# Patient Record
Sex: Female | Born: 1980 | Race: White | Hispanic: Yes | Marital: Married | Smoking: Never smoker
Health system: Southern US, Community
[De-identification: ages and names within clinical notes are randomized; demographics above are authoritative.]

## PROBLEM LIST (undated history)

## (undated) DIAGNOSIS — E039 Hypothyroidism, unspecified: Secondary | ICD-10-CM

## (undated) HISTORY — PX: APPENDECTOMY: SHX54

## (undated) HISTORY — DX: Hypothyroidism, unspecified: E03.9

---

## 2000-12-02 ENCOUNTER — Inpatient Hospital Stay (HOSPITAL_COMMUNITY): Admission: AD | Admit: 2000-12-02 | Discharge: 2000-12-06 | Payer: Self-pay | Admitting: *Deleted

## 2000-12-02 ENCOUNTER — Inpatient Hospital Stay (HOSPITAL_COMMUNITY): Admission: AD | Admit: 2000-12-02 | Discharge: 2000-12-02 | Payer: Self-pay | Admitting: *Deleted

## 2001-02-09 ENCOUNTER — Emergency Department (HOSPITAL_COMMUNITY): Admission: EM | Admit: 2001-02-09 | Discharge: 2001-02-09 | Payer: Self-pay | Admitting: Emergency Medicine

## 2001-02-09 ENCOUNTER — Encounter: Payer: Self-pay | Admitting: *Deleted

## 2003-05-20 ENCOUNTER — Encounter: Admission: RE | Admit: 2003-05-20 | Discharge: 2003-05-20 | Payer: Self-pay | Admitting: *Deleted

## 2003-05-27 ENCOUNTER — Encounter: Admission: RE | Admit: 2003-05-27 | Discharge: 2003-05-27 | Payer: Self-pay | Admitting: *Deleted

## 2003-05-29 ENCOUNTER — Encounter: Admission: RE | Admit: 2003-05-29 | Discharge: 2003-05-29 | Payer: Self-pay | Admitting: *Deleted

## 2003-05-30 ENCOUNTER — Inpatient Hospital Stay (HOSPITAL_COMMUNITY): Admission: AD | Admit: 2003-05-30 | Discharge: 2003-06-01 | Payer: Self-pay | Admitting: Obstetrics and Gynecology

## 2006-05-23 ENCOUNTER — Encounter (HOSPITAL_COMMUNITY): Admission: RE | Admit: 2006-05-23 | Discharge: 2006-05-24 | Payer: Self-pay | Admitting: Internal Medicine

## 2006-07-27 ENCOUNTER — Encounter: Payer: Self-pay | Admitting: Family Medicine

## 2008-09-28 ENCOUNTER — Encounter: Payer: Self-pay | Admitting: *Deleted

## 2008-09-28 ENCOUNTER — Ambulatory Visit: Payer: Self-pay | Admitting: Family Medicine

## 2008-09-28 DIAGNOSIS — E018 Other iodine-deficiency related thyroid disorders and allied conditions: Secondary | ICD-10-CM

## 2008-10-28 ENCOUNTER — Ambulatory Visit: Payer: Self-pay | Admitting: Family Medicine

## 2008-10-28 ENCOUNTER — Encounter: Payer: Self-pay | Admitting: Family Medicine

## 2008-10-30 LAB — CONVERTED CEMR LAB
Cholesterol: 125 mg/dL (ref 0–200)
HDL: 48 mg/dL (ref >39)
LDL Cholesterol: 66 mg/dL (ref 0–99)
TSH: 14.447 microintl units/mL — ABNORMAL HIGH (ref 0.350–4.500)
Total CHOL/HDL Ratio: 2.6
Triglycerides: 54 mg/dL (ref <150)
VLDL: 11 mg/dL (ref 0–40)

## 2008-12-30 ENCOUNTER — Encounter: Payer: Self-pay | Admitting: Family Medicine

## 2009-01-08 ENCOUNTER — Ambulatory Visit: Payer: Self-pay | Admitting: Family Medicine

## 2009-01-11 LAB — CONVERTED CEMR LAB: TSH: 1.029 microintl units/mL (ref 0.350–4.500)

## 2009-09-17 ENCOUNTER — Ambulatory Visit: Payer: Self-pay | Admitting: Family Medicine

## 2009-09-17 LAB — CONVERTED CEMR LAB: Pap Smear: NEGATIVE

## 2009-09-23 ENCOUNTER — Encounter: Payer: Self-pay | Admitting: Family Medicine

## 2009-11-24 ENCOUNTER — Ambulatory Visit: Payer: Self-pay | Admitting: Family Medicine

## 2009-11-24 LAB — CONVERTED CEMR LAB: TSH: 0.332 microintl units/mL — ABNORMAL LOW (ref 0.350–4.500)

## 2009-11-26 ENCOUNTER — Telehealth: Payer: Self-pay | Admitting: Family Medicine

## 2010-07-20 ENCOUNTER — Encounter: Payer: Self-pay | Admitting: Family Medicine

## 2010-07-21 ENCOUNTER — Telehealth: Payer: Self-pay | Admitting: *Deleted

## 2010-07-26 NOTE — Assessment & Plan Note (Signed)
Summary: cpp/[place]   Vital Signs:  Patient profile:   30 year old female Menstrual status:  regular Height:      61 inches Weight:      146 pounds BMI:     27.69 Temp:     97.7 degrees F oral Pulse rate:   60 / minute BP sitting:   105 / 68  (left arm) Cuff size:   regular  Vitals Entered By: Tessie Fass CMA (September 17, 2009 3:46 PM) CC: complete physical with pap Is Patient Diabetic? No Pain Assessment Patient in pain? no        CC:  complete physical with pap.  History of Present Illness: Visit conducted in [place].   Sandra [name] comes in today for her PAP smear.  She states her [facility] yrs ago, has never been abnormal.  LMP September 12, 2009, regular.  Uses rhythm method for pregnancy prevention, has been successful.    No STI history. Married, lives at home with sons (ages 17 and 65) and husband.   Prior hyperthyroidism, treated with RAIA by Dr Napoleon Form in [place].  Is still taking LT4, same dose.  Prior TSH in July normal.  Plans to recheck in July 2011.   Habits & Providers  Alcohol-Tobacco-Diet     Tobacco Status: never  Allergies: No Known Drug Allergies  Social History: Has lived here 8 years. From [place]. Lives here with her husband and 2 boys - 11 yo and 20 yo.  No smoking, no ETOH, no drugs.   September 17, 2009: Married, has 2 sons (ages 32 and 26).  Uses rhythm method. LMP 09/12/2009  Physical Exam  General:  Well-developed,well-nourished,in no acute distress; alert,appropriate and cooperative throughout examination Eyes:  L eye proptosis from prior hyperthyroidism  Abdomen:  Bowel sounds positive,abdomen soft and non-tender without masses, organomegaly or hernias noted. Genitalia:  Normal introitus for age, no external lesions, no vaginal discharge, mucosa pink and moist, no vaginal or cervical lesions, no vaginal atrophy, no friaility or hemorrhage, normal uterus size and position, no adnexal masses or tenderness   Impression &  Recommendations:  Problem # 1:  SCREENING FOR MALIGNANT NEOPLASM OF THE CERVIX (ICD-V76.2) Patient with no prior history of abnormal PAP by her report.  She states her last PAP was done in [facility] about 2 yrs ago, she was notified that it was normal.  Plan to repeat in 3 yrs if today's study comes back normal.   No cervical cultures collected, as without discharge or prior STI history.   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 Napoleon Form at [facility] and now treated for hypothyroidism.  Last July her TSH was normal.  For recheck in July of this year.  She knows to call ahead to let the lab know when she will come in.  I will contact her with the results.  Her updated medication list for this problem includes:    Synthroid 150 Mcg Tabs (Levothyroxine sodium) .Marland Kitchen... Take one tablet daily  Orders: [facility]- Est Level  3 ([address])Future Orders: TSH-[facility] ([id number]) ... 09/09/2010  Complete Medication List: 1)  Synthroid 150 Mcg Tabs (Levothyroxine sodium) .... Take one tablet daily  Patient Instructions: 1)  Fue un placer verle hoy en la consulta.  Le entro en contacto con los resultados del [place] cuando esten listos.  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  2)  Vuelva para el chequeo de la tiroide en el mes de [place].  No es necesario estar en ayunas para esta prueba de [place].  Puede venir cualquier dia entresemana.

## 2010-07-26 NOTE — Letter (Signed)
Summary: Generic Letter  [facility]  [address]   [place], [place] [address]   Phone: [phone]  Fax: [phone]    09/23/2009  [facility] [address] [place], [place]  [address]  [place]. RAMIREZ-GARCIA,  Espero que esta carta le [employer].  Escribo para decirle que el resultado del [place] fue NORMAL.  No hubo ningun hallazgo precanceroso en la [place].   Se recomienda que vuelva a repetir [employer].  Por favor llame con cualquier pregunta.    Sinceramente,   Paula Compton MD  Appended Document: Generic Letter mailed.

## 2010-07-26 NOTE — Assessment & Plan Note (Signed)
Summary: needs thyroid meds/Napavine/breen   Vital Signs:  Patient profile:   30 year old female Menstrual status:  regular Weight:      143.7 pounds Pulse rate:   69 / minute BP sitting:   116 / 73  (right arm)  Vitals Entered By: Arlyss Repress CMA, (November 24, 2009 2:31 PM) CC: refill thyroid meds. Is Patient Diabetic? No Pain Assessment Patient in pain? no        Primary Care Provider:  Paula Compton MD  CC:  refill thyroid meds..  History of Present Illness: 34 YOF w/ PMHx/o hypothyroidism s/p RAIA here for hypothyroidism followup: Hypothyroidism: Pt denies weight gain, change in appetite, HA, CP, Abd pain. Pt does report intermittent fatigue and dizziness as well as intermittent increased energy over last 1-2 weeks. Most recent TSH @ 1.03 10/2008 w/ synthroid @ 150 micrograms daily in setting of adequate synthroid titration. Pt reports compliance w/ medication. However, pt unable to obtain final refill. Has been without medication for 3 days.   Habits & Providers  Alcohol-Tobacco-Diet     Tobacco Status: never  Allergies: No Known Drug Allergies  Review of Systems       See HPI  Physical Exam  General:  alert, well-developed, and healthy-appearing.   Head:  normocephalic and atraumatic.   Eyes:  vision grossly intact.   Neck:  supple and full ROM.   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  PHQ 9 screen performed in setting of fatigue complaint. Score of 3. Pt denies depression sxs, SI/HI. Fatigue likely secondary to hypothyroidism. Case discussed w/ PCP Dr. Mauricio Po. Plan for pt to followup w/ PCP in 4 weeks for reassessment. Her updated medication list for this problem includes:    Synthroid 150 Mcg Tabs (Levothyroxine  sodium) .Marland Kitchen... Take one tablet daily  Orders: TSH-[facility] ([id number]) [facility]- Est Level  3 ([address]782)  Complete Medication List: 1)  Synthroid 150 Mcg Tabs (Levothyroxine sodium) .... Take one tablet daily Prescriptions: SYNTHROID 150 MCG TABS (LEVOTHYROXINE SODIUM) Take one tablet daily  #30 x 1   Entered and Authorized by:   Doree Albee MD   Signed by:   Doree Albee MD on 11/24/2009   Method used:   Print then Give to Patient   RxID:   [id number]

## 2010-07-26 NOTE — Progress Notes (Signed)
  Phone Note Outgoing Call   Call placed by: Paula Compton MD,  November 26, 2009 10:10 AM Call placed to: Patient Summary of Call: Call placed to patient, in Spanish.  Mildly low TSH with symptoms, taking current dose daily.  Will reduce dose at [facility], and recheck TSH in 6 to 8 weeks.  She knows to call the week prior to let us know when she will come for lab-only.  Initial call taken by: Paula Compton MD,  November 26, 2009 10:14 AM    New/Updated Medications: LEVOTHYROXINE SODIUM 125 MCG TABS (LEVOTHYROXINE SODIUM) 1 by mouth one time daily Spanish language instructions Prescriptions: LEVOTHYROXINE SODIUM 125 MCG TABS (LEVOTHYROXINE SODIUM) 1 by mouth one time daily Spanish language instructions  #30 x 6   Entered and Authorized by:   Paula Compton MD   Signed by:   Paula Compton MD on 11/26/2009   Method used:   Electronically to        [facility] [address] (retail)       [phone]       [place], [place]  [address]       Ph: [phone]       Fax: [phone]   RxID:   [id number]

## 2010-07-28 NOTE — Letter (Signed)
Summary: Generic Letter  [facility]  [address]   [place], [place] [address]   Phone: [phone]  Fax: [phone]    07/20/2010  [facility] [address] [place], [place]  [address]  Sandra [name],     Acabo de recibir un pedido para rellenar la receta para la medicina de la tiroide (levotiroxina).  Es muy importante que venga a nuestra oficina para volver a Corporate treasurer de sangre para la funcion tiroidea (no tiene que Engineer, maintenance).  Por favor llame [phone]) para marcar para el laboratorio lo antes posible.      Sinceramente,   Paula Compton MD  Appended Document: Generic Letter mailed

## 2010-07-28 NOTE — Progress Notes (Signed)
Summary: Refill  Phone Note Refill Request   Refills Requested: Medication #1:  LEVOTHYROXINE SODIUM 125 MCG TABS 1 by mouth one time daily Spanish language instructions. pt is running out medicine and pt does'nt have orange card. Pt will renew orange card this week and will call for OV    Initial call taken by: Marines Jean Rosenthal,  July 21, 2010 10:20 AM Caller: Patient  Follow-up for Phone Call        The medication is ready.  Will have interpreter call patient to let her know. Follow-up by: Dennison Nancy RN,  July 21, 2010 10:51 AM

## 2010-08-19 ENCOUNTER — Encounter: Payer: Self-pay | Admitting: *Deleted

## 2010-09-01 ENCOUNTER — Other Ambulatory Visit: Payer: Self-pay

## 2010-09-01 DIAGNOSIS — E018 Other iodine-deficiency related thyroid disorders and allied conditions: Secondary | ICD-10-CM

## 2010-09-01 LAB — TSH: TSH: 3.986 u[IU]/mL (ref 0.350–4.500)

## 2010-09-02 ENCOUNTER — Telehealth: Payer: Self-pay | Admitting: Family Medicine

## 2010-09-02 DIAGNOSIS — E039 Hypothyroidism, unspecified: Secondary | ICD-10-CM

## 2010-09-02 MED ORDER — LEVOTHYROXINE SODIUM 125 MCG PO TABS: 125.0000 ug | ORAL_TABLET | Freq: Every day | ORAL | Status: DC

## 2010-09-02 NOTE — Telephone Encounter (Signed)
Call completed in Spanish. Patient feels well.  TSH is 3.9.  Will refill LT4 daily, to recheck in 6 months. She voices understanding.

## 2010-10-20 ENCOUNTER — Encounter: Payer: Self-pay | Admitting: Family Medicine

## 2010-10-20 ENCOUNTER — Ambulatory Visit (INDEPENDENT_AMBULATORY_CARE_PROVIDER_SITE_OTHER): Payer: Self-pay | Admitting: Family Medicine

## 2010-10-20 DIAGNOSIS — Z23 Encounter for immunization: Secondary | ICD-10-CM

## 2010-10-20 DIAGNOSIS — S61219A Laceration without foreign body of unspecified finger without damage to nail, initial encounter: Secondary | ICD-10-CM | POA: Insufficient documentation

## 2010-10-20 DIAGNOSIS — S61209A Unspecified open wound of unspecified finger without damage to nail, initial encounter: Secondary | ICD-10-CM

## 2010-10-20 MED ORDER — TETANUS-DIPHTH-ACELL PERTUSSIS 5-2.5-18.5 LF-MCG/0.5 IM SUSP
0.5000 mL | Freq: Once | INTRAMUSCULAR | Status: AC
  Administered 2010-10-20: 0.5 mL via INTRAMUSCULAR

## 2010-10-20 MED ORDER — HYDROCODONE-ACETAMINOPHEN 5-500 MG PO TABS: 2.0000 | ORAL_TABLET | Freq: Four times a day (QID) | ORAL | Status: DC | PRN

## 2010-10-20 MED ORDER — AMOXICILLIN-POT CLAVULANATE 875-125 MG PO TABS: 1.0000 | ORAL_TABLET | Freq: Two times a day (BID) | ORAL | Status: AC

## 2010-10-20 NOTE — Progress Notes (Signed)
  Subjective:    Patient ID: Sandra [name], female    DOB: Jan 22, 1981, 30 y.o.   MRN: [id number]  HPI 1. Finger laceration:  Pt was washing dishes yesterday evening.   A plate broke and fell near a broken blender.  She reached in and the next thing she knew is that her finger was bleeding and painful.   It was her left 2nd finger.  She put some gauze on it and it did stop bleeding.  She is still having significant pain.  She is still able to move the finger but it is painful  Spent greater than 40 minutes with patient   Review of Systems Denies fevers, chills, malaise, n/v    Objective:   Physical Exam  Constitutional: No distress.  Cardiovascular: Normal rate and regular rhythm.   Pulmonary/Chest: Effort normal and breath sounds normal.  Musculoskeletal:       Left 2nd finger on the flexor side:  Large laceration from the lateral PIP joint down the finger then crosses over to the medial side.  No deep structures appear affected.  She is able to flex and extend her finger passively and against resistance.  2+ cap refill. N/V intact.  After numbing the finger it was reinspected.  Visualized the flexor tendon near the first intraphalangeal joint.  It is intact.  Strength is 5/5 to flexion and extension.           Assessment & Plan:

## 2010-10-20 NOTE — Assessment & Plan Note (Addendum)
Deep / long laceration.  I do not think that it involves any of the deeper structure like bone or tendon.  She has good ROM.  Finger is N/V intact.  Precepted with Dr. Deirdre Priest.  Will have patient come back tomorrow to recheck.  Tetanus shot given.  Antibiotics for prophylaxis and Vicodin for pain.  Procedure note: Consented patient about risks and benefits of the procedure with the use of an interpreter.  Sterilized finger with betadeine and alcohol swabs.  Pt prepped in normal sterile fashion.  Performed digital nerve block.  Thoroughly irrigated wound with sterile saline.  Inspected wound.  Deeper structures are intact.  5/5 strength to flexion and extension of the affected finger.  Did simple interrupted sutures with 3 Vicryl to help approximate the wound edges.  Pt tolerated the procedure well.  No immediate complications.  Minimal blood loss.  Splinted finger in a little bit of flexion.

## 2010-10-20 NOTE — Patient Instructions (Signed)
Laceration Care A laceration is a cut or lesion that goes through all layers of the skin and into the tissue just beneath the skin.  BEFORE THE PROCEDURE The laceration will be inspected by your caregiver for amount and extent of tissue damage, for bleeding, for evidence of foreign bodies (pieces of dirt, glass, etc.), and for cleanliness. Pain medications can be used if necessary. The wound will then be cleansed to help prevent infection. Sutures, staples or skin adhesive strips will be used to close the wound, stop bleeding and speed healing. Sometimes this will decrease the likelihood of infection and bleeding. LET YOUR CAREGIVERS KNOW ABOUT THE FOLLOWING:  Allergies.   Medications taken including herbs, eye drops, over the counter medications, and creams.     Use of steroids (by mouth or creams).     Previous problems with anesthetics or novocaine.     Possibility of pregnancy, if this applies.   History of blood clots (thrombophlebitis).     History of bleeding or blood problems.     Previous surgery.     Other health problems.     RISKS AND COMPLICATIONS Most lacerations heal fully. The healing time required varies depending on location. Complications of a laceration can include pain, bleeding, infection, dehiscence (splitting open or separation of the wound edges) and scar formation. The likelihood of complication depends on wound complexity, location, and on how the laceration occurred. HOME CARE INSTRUCTIONS  Do not get the splint wet  Only take over-the-counter or prescription medicines for pain, discomfort, or fever as directed by your caregiver.   Have your sutures, staples or skin adhesive strips removed as instructed. Skin adhesive strips will peel off from the outer edges toward the center and will eventually fall off. Report to your caregiver if the strips are falling off and the wound does not appear fully healed.  SEEK MEDICAL CARE IF:  There is redness, swelling,  or increasing pain in the wound.   There is a red line that goes up your arm or leg.   Pus is coming from wound.   You develop an unexplained oral temperature above 100.4, or as your caregiver suggests.   You notice a foul smell coming from the wound or dressing.   There is a breaking open of the wound (edges not staying together) before or after sutures have been removed.   You notice something coming out of the wound such as wood or glass.   The wound is on your hand or foot and you find that you are unable to properly move a finger or toe.   There is severe swelling around the wound causing pain and numbness or a change in color in your arm, hand, leg, or foot.  SEEK IMMEDIATE MEDICAL CARE IF:  Pain is not controlled with prescribed medication or with acetaminophen or ibuprofen.   There is severe swelling around the wound site.   The wound splits open and bleeding recurs.   You experience worsening numbness, weakness, or loss of function of any joint around or beyond the wound.   You develop painful lumps near the wound or on the skin anywhere on your body.  If you did not receive a tetanus shot today because you did not recall when your last one was given, make sure to check with your caregiver when you have your sutures removed to determine if you need one. MAKE SURE YOU:    Understand these instructions.   Will watch your condition.  Will get help right away if you are not doing well or get worse.   Please schedule a follow up appointment tomorrow Document Released: 06/12/2005 Document Re-Released: 11/30/2009 [facility] Patient Information 2011 [place], [place].

## 2010-10-21 ENCOUNTER — Ambulatory Visit (INDEPENDENT_AMBULATORY_CARE_PROVIDER_SITE_OTHER): Payer: Self-pay | Admitting: Family Medicine

## 2010-10-21 ENCOUNTER — Encounter: Payer: Self-pay | Admitting: Family Medicine

## 2010-10-21 VITALS — BP 115/74 | HR 66 | Temp 97.7°F | Wt 147.0 lb

## 2010-10-21 DIAGNOSIS — S61209A Unspecified open wound of unspecified finger without damage to nail, initial encounter: Secondary | ICD-10-CM

## 2010-10-21 DIAGNOSIS — S61219A Laceration without foreign body of unspecified finger without damage to nail, initial encounter: Secondary | ICD-10-CM

## 2010-10-21 NOTE — Progress Notes (Signed)
  Subjective:    Patient ID: Sandra [name], female    DOB: 04/29/1981, 30 y.o.   MRN: [id number]  HPI Interpretor was present during exam.   Pt returns for f/u for laceration to L index finger that was sutured yesterday.  She states that she is having pain but she took 2 tabs of hydrocodone and it caused vomiting and drowsiness for the entire day.  She has not started on amoxicillin yet because she did not know what it was for.   She has returned to work and does not need a work note.   Her husband is helping around the house and is doing the dishes and cooking.   Review of Systems  Constitutional: Negative for fever and chills.  Gastrointestinal: Negative for nausea, vomiting and constipation.       Objective:   Physical Exam  Constitutional: She appears well-developed and well-nourished. No distress.  Musculoskeletal:       +2 radial pulse L index finger: Laceration with sutures intact. No erythema, streaking.  Pt able to flex mildly.  Strength is intact.  She is able to oppose the thumb.  Mild tenderness on exam.            Assessment & Plan:

## 2010-10-21 NOTE — Patient Instructions (Signed)
Please make an appointment next Friday for suture removal. Please take antibiotic at breakfast and dinner (Amoxicillin). You can take 1/2 or 1 tablet of pain medicine so that you don't vomit.

## 2010-10-21 NOTE — Assessment & Plan Note (Signed)
Pt is here for recheck of laceration on left index.  Laceration looks well.  Pt has not started on Amx yet. Advised her to do this.  Advised to cut hydrocodone in half or 1 tab prn pain.  Pt to rtc in 1 wk for suture removal.

## 2010-10-28 ENCOUNTER — Ambulatory Visit: Payer: Self-pay | Admitting: *Deleted

## 2010-10-28 NOTE — Progress Notes (Signed)
Went out to call  Patient  Back  and she had already left .

## 2010-11-11 NOTE — Discharge Summary (Signed)
[facility]  Patient:    Sandra [name], Sandra [name]                MRN: [id number] Adm. Date:  [id number] Disc. Date: [id number] Attending:  Michaelle Copas Dictator:   Santiago Bumpers, M.D.                           Discharge Summary  ADMISSION DIAGNOSES:          1. A 40 week 5 day intrauterine pregnancy,                                  early active labor.                               2. GBS positive.  DISCHARGE DIAGNOSES:          1. A 40 week 5 day intrauterine pregnancy, early                                  active labor.                               2. GBS positive.                               3. Successful cesarean delivery of a viable female                                  infant.                               4. Chorioamnionitis.  FELLOW:                       Jamey Reas, M.D.  CONSULTS:                     None.  PROCEDURE:                    Low transverse cesarean section on December 03, 2000.  INDICATIONS:                  Arrest of descent with prolonged second stage labor.  FINDINGS:                     Viable female infant with Apgars of 8 at one minute and 9 at five minutes.  Weight was 7 pounds 8 ounces.  Estimated blood loss 700 cc.  No complications.  HISTORY AND PHYSICAL:         For complete H&P please see resident H&P in chart.  Briefly, this was a 31 year old Hispanic female G2, P0-0-1-0 at 40 weeks 5 days intrauterine pregnancy who presented in early active labor. Physical examination was within normal limits with a cervical examination of 3 cm, 100% effaced, and -1 station with vertex presentation.  Fetal heart tracing was reactive and reassuring.  The patient was admitted to the L&D for expectant management.  Of note, patient was group B strep positive.  HOSPITAL COURSE:              Patient was admitted to L&D for routine intrapartum care and begun on group B strep prophylaxis with penicillin. Patients  contractions began to space out so she was augmented with Pitocin. She did have slow progression of labor with Pitocin, but did fully dilate and pushed for at least three hours in several different positions but there was failure of the fetus to descend.  It was determined that the position of the fetus was direct OP.  Secondary to persistent OP and failure to descend, the patient was taken for cesarean delivery.  Findings were as dictated in the procedure section.  Postoperatively the patient got routine postoperative care and was afebrile.  In the active phase of labor patient did have a low grade temperature. Antibiotics were changed from penicillin to Unasyn.  These antibiotics were continued postoperatively until IV infiltrated.  She was then continued on Augmentin until the day of discharge.  She was afebrile in the postoperative course and fundus was firm and nontender below the umbilicus.  Lochia was scant.  She was breast-feeding without difficulty and was determined to be ready for discharge home on postoperative day #3.  Incision appeared clean, dry, and intact.  DISCHARGE MEDICATIONS:        1. Percocet one to two q.4-6h. p.r.n. for pain.                               2. Micronor one tablet q.d. at the same time                                  each day.                               3. Prenatal vitamins one q.d. for six weeks.  DISCHARGE INSTRUCTIONS:       She was instructed to have [facility]  The infant did receive a circumcision and was discharged home with the mother. DD:  01/10/01 TD:  01/10/01 Job: [id number]

## 2010-11-11 NOTE — Op Note (Signed)
[facility]  Patient:    Sandra [name], Sandra [name]                MRN: [id number] Proc. Date: 12/03/00 Adm. Date:  [id number] Attending:  Michaelle Copas                           Operative Report  PREOPERATIVE DIAGNOSIS:       Arrest of descent, persistent occiput posterior                               position.  POSTOPERATIVE DIAGNOSIS:      Arrest of descent, persistent occiput posterior                               position.  PROCEDURE:                    Primary low transverse cesarean section.  SURGEON:                      Charles A. Clearance Coots, M.D.  ASSISTANT:                    Jamey Reas, M.D.  ANESTHESIA:                   Epidural.  ESTIMATED BLOOD LOSS:         700 Ml.  IV FLUIDS:                    2100 Ml.  URINE OUT:                    250 Ml, clear.  COMPLICATIONS:                None.  FINDINGS:                     A viable female at 75, Apgars of 8 at one minute and 9 at five minutes.  Weight 7 pounds 8 ounces.  Normal uterus, ovaries, and fallopian tubes.  DESCRIPTION OF PROCEDURE:     The patient was brought to the operating room. After satisfactory redosing of the epidural, the abdomen was prepped and draped in the usual sterile fashion.  A Pfannenstiel skin incision was made with the scalpel that was deepened down to the fascia with the scalpel.  The fascia was nicked in the midline and the fascial incision was extended to the left and to the right with curved Mayo scissors.  The superior and inferior fascial edges were taken off of the rectus muscles with both blunt and sharp dissection.  The rectus muscle was bluntly divided in the midline and the peritoneum was entered digitally and was digitally extended to the left and to the right.  The bladder blade was positioned and the vesicouterine fold of the peritoneum above the reflection of the urinary bladder was grasped with forceps and incised and  undermined with Metzenbaum scissors.  The incision was extended to the left and to the right with the Metzenbaum scissors.  The bladder flap was bluntly developed and the bladder blade was repositioned in front of the urinary bladder, placing it well out of the operative field.  The uterus was then entered transversely in the lower  uterine segment with the scalpel down to the amniotic sac.  The uterine incision was extended to the left and to the right with the bandage scissors in a smile configuration.  The amniotic sac was ruptured and the vertex was noted to be in occiput posterior position.  The occiput was rotated into the incision and the delivery was completed with the aid of fundal pressure from the assistant.  The infants mouth and nose were suctioned with the suction bulb, and delivery was completed with the aid of the fundal pressure from the assistant.  The umbilical cord was doubly clamped and cut, and the infant was handed off to the nursing staff.  The cord blood was obtained, and the placenta was spontaneously expelled from the uterine cavity intact.  The uterus was exteriorized and the endometrial surface was debrided with a dry lap sponge. The edges of the uterine incision were grasped with a ring forceps.  The uterus was closed with a continuous interlocking suture of #0 Monocryl, each corner to the center.  Hemostasis was excellent and the uterus was placed back in its normal anatomic position.  The pelvic cavity was thoroughly irrigated with warm saline solution, and all clots were removed.  The closure of the uterus was again observed for hemostasis, and there was no active bleeding noted.  The abdomen was then closed as follows:  The rectus muscle was approximated with interrupted suture of #0 Monocryl.  The fascia was closed with a continuous suture of #0 Panacryl from each corner to the center.  The subcutaneous tissue was thoroughly irrigated with warm saline  solution.  All areas of subcutaneous bleeding were coagulated with the Bovie.  The skin was then approximated with surgical stainless steel staples.  A sterile bandage was applied to the incision closure.  The surgical technician indicated that all needle, sponge, and instrument counts were correct.  The patient tolerated the procedure well and was transported to the recovery room in satisfactory condition. DD:  12/03/00 TD:  12/03/00 Job: [id number]

## 2011-08-15 ENCOUNTER — Other Ambulatory Visit: Payer: Self-pay

## 2011-08-15 DIAGNOSIS — E039 Hypothyroidism, unspecified: Secondary | ICD-10-CM

## 2011-08-16 ENCOUNTER — Telehealth: Payer: Self-pay | Admitting: Family Medicine

## 2011-08-16 DIAGNOSIS — E039 Hypothyroidism, unspecified: Secondary | ICD-10-CM

## 2011-08-16 MED ORDER — LEVOTHYROXINE SODIUM 150 MCG PO TABS: 150.0000 ug | ORAL_TABLET | Freq: Every day | ORAL | Status: DC

## 2011-08-16 NOTE — Telephone Encounter (Signed)
Called patient, call completed in Spanish.   I called her to report her high TSH values.  She acknowledges that she has indeed been taking her LT4 daily, without fail.  Feels well.   I am increasing her dose to daily, new Rx sent to [facility].  She is aware of this change.  She will call back in mid-late April for another TSH check, and then to see me in the office soon thereafter.    I have told her we will cancel her upcoming appointment for next week, and she will reschedule for late April with me.  Paula Compton, M.D.

## 2011-08-22 ENCOUNTER — Ambulatory Visit: Payer: Self-pay | Admitting: Family Medicine

## 2011-10-16 ENCOUNTER — Other Ambulatory Visit: Payer: Self-pay

## 2011-10-16 DIAGNOSIS — E039 Hypothyroidism, unspecified: Secondary | ICD-10-CM

## 2011-10-16 NOTE — Progress Notes (Signed)
tsh done today Sandra [name] 

## 2011-10-17 LAB — TSH: TSH: 0.457 u[IU]/mL (ref 0.350–4.500)

## 2011-10-18 ENCOUNTER — Telehealth: Payer: Self-pay | Admitting: Family Medicine

## 2011-10-18 NOTE — Telephone Encounter (Signed)
Called patient, completed in Spanish.  Reported her normal TSH level.  She feels well. To take LT4 daily, recheck in 1 year. JB

## 2012-02-13 ENCOUNTER — Ambulatory Visit (INDEPENDENT_AMBULATORY_CARE_PROVIDER_SITE_OTHER): Payer: Self-pay | Admitting: Family Medicine

## 2012-02-13 ENCOUNTER — Encounter: Payer: Self-pay | Admitting: Family Medicine

## 2012-02-13 VITALS — BP 132/86 | HR 71 | Ht 61.0 in | Wt 158.0 lb

## 2012-02-13 DIAGNOSIS — Z331 Pregnant state, incidental: Secondary | ICD-10-CM

## 2012-02-13 DIAGNOSIS — E018 Other iodine-deficiency related thyroid disorders and allied conditions: Secondary | ICD-10-CM

## 2012-02-13 DIAGNOSIS — N912 Amenorrhea, unspecified: Secondary | ICD-10-CM

## 2012-02-13 DIAGNOSIS — Z3480 Encounter for supervision of other normal pregnancy, unspecified trimester: Secondary | ICD-10-CM | POA: Insufficient documentation

## 2012-02-13 LAB — TSH: TSH: 3.964 u[IU]/mL (ref 0.350–4.500)

## 2012-02-13 NOTE — Progress Notes (Signed)
  Subjective:    Patient ID: Sandra [name], female    DOB: Dec 17, 1980, 31 y.o.   MRN: [id number]  HPI VIsit conducted in Spanish.  Patient with diagnosed hypothyroidism controlled on LT4 with most recent check TSH 3 months ago, presents for LMP June 10th, 2013 and home urine pregnancy tests positive on 2 occasions.  She was not planning to become pregnant, had been using [employer] successfully for several years but admits to an indiscretion in the method that she believes is why she became pregnant. By her LMP of 12/04/2011 she is [redacted]w[redacted]d today, with EDD of 09/09/2012. She is taking PNVs which she bought over the counter.   She is a [id number]: #1. 2000, 1st trimester SAB in [place]. #2. 2002, term delivery son (Sandra [name]), BW 7# at [facility].  #3. Dec 2004, term delivery son Sandra [name]) BW 8#6oz, [facility]. #4 current pregnancy.   Denies complications with prior pregnancies. Had been through [facility] for 2 most recent pregnancies, is going to [facility] with letter today.  Had been with Dr Elsie Stain practice in the past; plans to have prenatal care with our practice this time.   Family Hx; Mother with DM adult-onset. Patient herself has never had GDM or HTN.    PMHX; Hypothyroidism; had run out of LT4 prior to last lab, when she had an elevated TSH.  She is worried about the effects of LT4 on pregnancy; we discussed the need for higher doses of LT4 during pregnancy.    Review of Systems IS having nausea without emesis; no fevers or chills; no vaginal discharge or bleeding.     Objective:   Physical Exam Well appearing, no apparent distress HEENT Neck supple. No cervical adenopathy. No thyroid nodules or megaly.  COR regular S1S2 PULM Clear bilat ABD Soft, nontender. Attempt at doppler heart tones did not identify fetal heart tones.        Assessment & Plan:

## 2012-02-13 NOTE — Patient Instructions (Addendum)
Felicidades por Reginia Naas!   Debe llevar la carta a [facility] en [address] [place].  Alli le matriculara'n con [facility] para su atencion prenatal.  Siga tomando las vitaminas antenatales, tanto como la medicina para la tiroide ( diario).  Posiblemente tendremos que aumentar la dosis durante el [employer].

## 2012-02-13 NOTE — Assessment & Plan Note (Addendum)
She is taking LT4 daily at this time; will check TSH and free T4 today; likely increase dose of LT4 given pregnancy.  To rehcheck every 4 weeks. Target TSH in 1st trimester <2.5; for 2nd and 3rd trimester <3.0.

## 2012-02-14 ENCOUNTER — Telehealth: Payer: Self-pay | Admitting: Family Medicine

## 2012-02-14 DIAGNOSIS — E018 Other iodine-deficiency related thyroid disorders and allied conditions: Secondary | ICD-10-CM

## 2012-02-14 MED ORDER — LEVOTHYROXINE SODIUM 150 MCG PO TABS: ORAL_TABLET | ORAL | Status: DC

## 2012-02-14 NOTE — Telephone Encounter (Signed)
Call completed in Spanish, to update patient on her TSH results.  She is approx [redacted]w[redacted]d EGA today; s/p RAIA treatment for Graves disease in the past.  Will target 1st Trimester TSH of <2.5; 2nd/3rd trimester TSH <3.0.    She is to increase her dose from once daily to on M/W/F and the other four days of the week.  Recheck TSH in 4 more weeks.  A future-order was placed for this lab check.  She voices understanding and is able to repeat back in teach-back.  Answered questions to her satisfaction.  Paula Compton, MD

## 2012-02-27 ENCOUNTER — Other Ambulatory Visit: Payer: Self-pay

## 2012-02-27 DIAGNOSIS — Z331 Pregnant state, incidental: Secondary | ICD-10-CM

## 2012-02-27 NOTE — Progress Notes (Signed)
PRENATAL LABS DONE TODAY Sandra [name] 

## 2012-02-28 LAB — OBSTETRIC PANEL
Antibody Screen: NEGATIVE
Basophils Relative: 0 % (ref 0–1)
Eosinophils Absolute: 0.1 10*3/uL (ref 0.0–0.7)
Eosinophils Relative: 1 % (ref 0–5)
Hemoglobin: 12.3 g/dL (ref 12.0–15.0)
Hepatitis B Surface Ag: NEGATIVE
Lymphs Abs: 1.7 10*3/uL (ref 0.7–4.0)
MCH: 32.2 pg (ref 26.0–34.0)
MCHC: 35 g/dL (ref 30.0–36.0)
MCV: 91.9 fL (ref 78.0–100.0)
Monocytes Absolute: 0.7 10*3/uL (ref 0.1–1.0)
Monocytes Relative: 7 % (ref 3–12)
Neutrophils Relative %: 74 % (ref 43–77)
Rh Type: POSITIVE

## 2012-02-28 LAB — SICKLE CELL SCREEN: Sickle Cell Screen: NEGATIVE

## 2012-02-28 LAB — HIV ANTIBODY (ROUTINE TESTING W REFLEX): HIV: NONREACTIVE

## 2012-02-29 LAB — CULTURE, OB URINE: Colony Count: 9000

## 2012-03-05 ENCOUNTER — Other Ambulatory Visit (HOSPITAL_COMMUNITY)
Admission: RE | Admit: 2012-03-05 | Discharge: 2012-03-05 | Disposition: A | Discharge status: Home / Self Care | Payer: Self-pay | Source: Ambulatory Visit | Attending: Family Medicine | Admitting: Family Medicine

## 2012-03-05 ENCOUNTER — Encounter: Payer: Self-pay | Admitting: Family Medicine

## 2012-03-05 ENCOUNTER — Ambulatory Visit (INDEPENDENT_AMBULATORY_CARE_PROVIDER_SITE_OTHER): Payer: Self-pay | Admitting: Family Medicine

## 2012-03-05 VITALS — BP 113/71 | Temp 98.1°F | Wt 160.2 lb

## 2012-03-05 DIAGNOSIS — Z01419 Encounter for gynecological examination (general) (routine) without abnormal findings: Secondary | ICD-10-CM | POA: Insufficient documentation

## 2012-03-05 DIAGNOSIS — Z23 Encounter for immunization: Secondary | ICD-10-CM

## 2012-03-05 DIAGNOSIS — E018 Other iodine-deficiency related thyroid disorders and allied conditions: Secondary | ICD-10-CM

## 2012-03-05 DIAGNOSIS — Z3480 Encounter for supervision of other normal pregnancy, unspecified trimester: Secondary | ICD-10-CM

## 2012-03-05 DIAGNOSIS — Z331 Pregnant state, incidental: Secondary | ICD-10-CM

## 2012-03-05 DIAGNOSIS — Z3400 Encounter for supervision of normal first pregnancy, unspecified trimester: Secondary | ICD-10-CM

## 2012-03-05 LAB — OB RESULTS CONSOLE GC/CHLAMYDIA
Chlamydia: NEGATIVE
Gonorrhea: NEGATIVE

## 2012-03-05 LAB — GLUCOSE, CAPILLARY: Comment 1: 1

## 2012-03-05 NOTE — Progress Notes (Incomplete)
Fetal heart rate not found by dopler.  Confirmed using [place].  Fetal movement noted.

## 2012-03-05 NOTE — Progress Notes (Signed)
  Subjective:    Sandra [name] is being seen today for her first obstetrical visit.  This is not a planned pregnancy. She is at [redacted]w[redacted]d gestation. Her obstetrical history is significant for none.. Relationship with FOB: spouse, living together. Patient unsure intend to breast feed. Pregnancy history fully reviewed.  Patient reports no complaints.  Review of Systems:   Review of Systems  Objective:     BP 113/71  Temp 98.1 F (36.7 C)  Wt 160 lb 3.2 oz (72.666 kg)  LMP 12/04/2011 Physical Exam  Exam BP 113/71  Temp 98.1 F (36.7 C)  Wt 160 lb 3.2 oz (72.666 kg)  LMP 12/04/2011 General appearance: alert and cooperative Head: Normocephalic, without obvious abnormality, atraumatic Eyes: negative findings: lids and lashes normal and conjunctivae and sclerae normal Lungs: clear to auscultation bilaterally Heart: regular rate and rhythm and systolic murmur: early systolic 3/6,  at 2nd left intercostal space, at 2nd right intercostal space Abdomen: soft, non-tender; bowel sounds normal; no masses,  no organomegaly Pelvic: cervix normal in appearance, external genitalia normal, no adnexal masses or tenderness, no cervical motion tenderness, rectovaginal septum normal, uterus normal size, shape, and consistency and vagina normal without discharge Extremities: extremities normal, atraumatic, no cyanosis or edema    Assessment:    Pregnancy: [id number] Patient Active Problem List  Diagnosis  . HYPOTHYROIDISM, POST-RADIOACTIVE IODINE  . Pregnant state, incidental       Plan:     Initial labs drawn. Prenatal vitamins. Problem list reviewed and updated. AFP3 discussed: declined. Role of ultrasound in pregnancy discussed; fetal survey: requested. Amniocentesis discussed: not indicated. Follow up in 4 weeks. 50% of 60 min visit spent on counseling and coordination of care.   Early glucola TSH 2 weeks Anatomy Scan 4 weeks   Sandra [name] 03/05/2012

## 2012-03-05 NOTE — Patient Instructions (Addendum)
It was great to see you today!  Congratulations on your pregnancy! We will need you to get your thyroid checked in 2 weeks. Come back in 4 weeks. I would like you to schedule your ultrasound with Dr. Gaynell Face in about 4 weeks (when you will be about [redacted] weeks along)  El ABC del Psychiatrist (ABCs of Pregnancy) A A La atencin antes del parto es muy importante. Asegrese de Science writer a su mdico y recibir atencin prenatal tan rpido como usted crea que est embarazada. En este momento, se la controlar por posibles infecciones, anormalidades genticas y problemas potenciales. Este es el momento para discutir la dieta, el ejercicio, el [place], los medicamentos, el [place], los medicamentos para el dolor durante el parto y la posibilidad de un parto por Copy. Haga todas las preguntas que puedan preocuparla. Es importante que consulte a su mdico con regularidad durante todo Firefighter. Evite el contacto con sustancias txicas y productos qumicos, como disolventes de limpieza, plomo y mercurio, algunos insecticidas y pinturas. Las mujeres embarazadas deben evitar la exposicin a los vapores de [place], y los humos que la hagan sentir enferma, mareada o dbil. Cuando sea posible, tenga una consulta con su mdico antes del embarazo para recibir Optometrist pudiera sugerir, como tomar cido flico, Radio producer ejercicios, dejar de fumar, evitar las bebidas alcohlicas, etc. B La lactancia materna es la opcin ms saludable para usted y su beb. Tiene muchos beneficios nutricionales para al beb y [address] Tambin crea un vnculo muy estrecho entre el beb y [place]. Hable con su mdico, su familia, sus amigos y su empleador acerca de cmo usted decidir alimentar a su beb y como pueden ayudarle a decidir. No todos los defectos congnitos pueden ser evitados, pero una mujer puede tomar decisiones para aumentar las posibilidades de tener un beb sano. Muchos defectos congnitos ocurren al principio del  Psychiatrist, a veces antes de que la mujer sepa que est [place]. Los defectos o anormalidades congnitas de cualquier nio de su familia o la del padre deben ser comentados con su mdico. Obtener un buen sostn para los cambios del tamao de las [place]. selo en especial cuando hace ejercicios o cuando amamante.   C Festeje la noticia de su embarazo con su Cnyuge/padre y su familia. Las Clases de parto son tiles para usted y Product/process development scientist cnyuge/padre, porque le ayudan a entender lo que sucede durante el Psychiatrist y [place]. El parto por cesrea se debe discutir con el mdico para estar preparado para esa posibilidad. Las ventajas y las desventajas de la [place] si es un nio, se deben comentar con Presenter, broadcasting. El tabaquismo durante el embarazo puede dar como resultado bebs con bajo peso al nacer. Se ha asociado con la infertilidad, abortos espontneos, embarazos ectpicos, mortalidad infantil y Insurance underwriter salud (morbilidad) en la infancia. Adems, el tabaquismo puede causar problemas de aprendizaje a largo plazo. Si usted fuma, debe tratar de dejar de fumar antes de [place] y no durante el embarazo. El humo secundario tambin puede hacerles dao a la mam y a su beb en desarrollo. Es Neomia Dear buena idea pedirle a la gente que deje de fumar a su alrededor Academic librarian y despus del nacimiento del beb. El Calcio extra es necesario durante el Psychiatrist y se Occupational psychologist en las vitaminas prenatales, en los productos lcteos, vegetales de hojas verdes y en los suplementos de calcio. D Una Dieta saludable de acuerdo a su peso y su talla actual, junto con las  vitaminas y suplementos minerales debe ser discutida con su mdico. El abuso /o la violencia Domstica deben darse a conocer inmediatamente para corregir la situacin. Tome ms agua cuando haga ejercicios para mantenerse hidratada. Por lo general las molestias de la espalda y las piernas aparecen y avanzan a Glass blower/designer de mediados del segundo trimestre hasta el  nacimiento del beb. Eso se debe al aumento de tamao del beb y del tero, que tambin puede afectar su equilibro. No consuma Drogas. Las drogas ilegales pueden daar seriamente al beb y a usted. Beba ms lquidos (lo mejor es el agua) Durante todo el embarazo para ayudar a su cuerpo a Radio producer aumento del volumen sanguneo. Beba por lo menos 6 a 8 vasos de [place], jugo de frutas o [employer] Group. Una buena manera de saber que est bebiendo suficiente lquido es cuando la [place] se ve casi como el agua clara o de color amarillo muy claro.   E Coma alimentos sanos para obtener los nutrientes que usted y su beb necesitan al Psychologist, clinical. Sus alimentos deben Johnson & Johnson cinco grupos bsicos. El Ejercicio (30 minutos de ejercicio leve a moderado al da) es importante y se aconseja durante el Rafael Hernandez, si no tiene problemas de [place]. Si el ejercicio le causa malestar o mareos debe interrumpirlo e informar a su mdico. Las emociones durante el embarazo pueden pasar de la euforia a la depresin y deben ser comprendidos por usted, su pareja y su familia. F La evaluacin fetal con ecografas, la amniocentesis y los controles durante el [place] y [place] parto son algo habitual y a Research officer, political party. Tome 400 mg. de cido Southern Company, si es posible antes y Energy Transfer Partners primeros meses del Psychiatrist para reducir el riesgo de defectos congnitos del cerebro y la columna vertebral. Todas las mujeres que podran quedar embarazadas deben tomar una vitamina con cido flico todos [place]. Tambin es importante seguir una dieta saludable con alimentos enriquecidos (cereales enriquecidos, arroz, panes y pastas) y alimentos que sean fuentes naturales de cido flico (jugo de naranja, vegetales de hojas verdes, frijoles, man, brcoli, esprragos, arvejas y Therapist, occupational). El padre debe involucrarse en todos los aspectos del embarazo incluyendo el cuidado prenatal, las clases de [place], [place] parto y el tiempo despus del [place]. Los padres  tambin pueden tener problemas emocionales como ser [place], [place] econmicos y llevar adelante [place]. G Las pruebas Genticas se deben Landscape architect. Es Secretary/administrator la historia de su familia y la del padre. Si ha habido problemas con embarazos o defectos de nacimiento en su familia, informe de inmediato a su mdico. Adems, los consejeros genticos pueden hablar con usted acerca de la informacin que pueda necesitar en la toma de decisiones acerca de tener una familia. Usted puede llamar a un centro mdico en su rea para ayudar en la bsqueda de un consejero gentico certificado por el consejo. La asesora y prueba gentica se deben hacer antes del embarazo siempre que sea posible, especialmente si hay antecedentes de problemas en la familia del padre o de la [place]. Ciertos grupos tnicos tienen un mayor riesgo de defectos genticos. H Familiarcese con [facility] en el que va a tener su beb. Conozca cunto tiempo se tarda en llegar, la sala de preparto y nacimiento y los procedimientos del hospital. Asegrese de que su seguro mdico es aceptado en ese Environmental consultant. Haga que su Hogar est listo para el beb, incluyendo la ropa, la habitacin del beb (cuando sea posible), muebles y asientos del  automvil. El lavado de manos es importante durante todo el da, especialmente despus de tocar carne cruda y aves de corral, de cambiar el paal del beb y de ir al bao. Esto puede ayudarle a Geologist, engineering de bacterias y virus que causan infecciones. Su pelo puede volverse seco y ms fino, pero volver a la normalidad unas semanas despus de que nazca el beb. La acidez es un problema comn que puede ser tratado tomando anticidos recetados por su mdico, comiendo alimentos en porciones ms pequeas 5  6 veces por da, no beber lquidos al comer, beber entre comidas y elevar la cabecera de su cama 2  3 pulgadas. I El seguro que le d cobertura a usted y al beb, a los gastos del mdico y  del hospital debe ser revisado, para saber con anticipacin qu gastos no cubiertos por su plan de seguro deber pagar usted. Si no tiene seguro mdico, por lo general hay clnicas y servicios disponibles para usted en su comunidad. Tome 30 mg. de hierro durante el Big Lots segn lo prescrito por su mdico para reducir el riesgo de niveles bajos de glbulos rojos (anemia) mas adelante en el embarazo. Todas las mujeres en edad frtil deben consumir una dieta rica en hierro. J La madre, el padre y los otros hijos deben hacer un esfuerzo conjunto para adaptarse al Big Lots en el aspecto econmico, emocional y psicolgico durante Firefighter. nase a un grupo de apoyo para las futuras [place]. O bien, vaya a clases de crianza de hijos o del parto. La familia tiene que participar cuando sea posible. K Conozca sus lmites. Infrmele a su mdico si experimenta:    Cualquier tipo de dolor.   Clicos fuertes.   Aumenta de peso en un corto perodo de tiempo (5 libras en 3 a 5 das).   Hemorragia vaginal, prdida de lquido amnitico.   Dolor de [place], problemas de visin.   Mareos, Newell Rubbermaid, le falta el aire.   Dolor en el pecho.   Fiebre de 102 F (38.9 C) o ms.   Elimina lquido claro por la vagina.   Dolor al Beatrix Shipper.   Violencia familiar.   Latidos irregulares del corazn (palpitaciones).   Latidos rpidos del corazn (taquicardia).   Siente ganas de vomitar (nuseas) y vmitos.   Dificultad para caminar, retencin de lquidos (edema).   Debilidad muscular.   Si su beb tiene disminucin de [place].   Diarrea persistente.   Flujo vaginal anormal.   Contracciones uterinas en intervalos de 20 minutos.   Dolor de espalda que baja por la pierna.  L Aprenda y practique que, Lo que usted come y bebe debe ser con moderacin y sano para usted y su beb. Las drogas PepsiCo el alcohol y la cafena son temas importantes para las mujeres [place]. No hay una cantidad segura  de alcohol que una mujer pueda beber durante el [place]. El sndrome de alcoholismo fetal, un trastorno que se caracteriza por retraso del crecimiento, anormalidades faciales y disfuncin del sistema nervioso central, es causado por el uso de alcohol de la mujer durante el [place]. La cafena, que se encuentra en el t, caf, refrescos y chocolate, tambin deben ser limitados. Asegrese de leer las etiquetas cuando se trata de reducir el consumo de cafena durante el [place]. Ms de 200 alimentos, bebidas y medicamentos sin receta contienen cafena y tienen un ato contenido de sal! Hay cafs y tes que no contienen cafena.   M Las enfermedades mdicas tales como diabetes,  epilepsia e hipertensin arterial deben ser tratados y mantenidos bajo control antes del embarazo siempre que sea posible, pero especialmente durante el [place]. Consulte con su mdico acerca de cualquier medicamento que pueda ser necesario cambiar o ajustar la dosis. Si est tomando algn medicamento, consulte con su mdico sobre su seguridad para Celanese Corporation o antes de quedar [place], cuando sea posible. Tambin, asegrese de informar todas las hierbas o vitaminas que est tomando. Tambin se trata de medicamentos! Hable con su mdico sobre The Mutual of Omaha, con receta y de venta libre que est tomando. Durante su visita prenatal, discuta los Chesapeake Energy su mdico le puede dar durante el parto y el nacimiento. N Nunca tenga miedo de preguntar a su mdico acerca de su salud, el progreso del [place], problemas familiares, situaciones de estrs y pedirle la recomendacin de un pediatra, si no tiene uno. Es mejor tomar todas las precauciones y Administrator, Civil Service pregunta o preocupacin que usted tenga durante las visitas a su consultorio. Es Neomia Dear buena idea escribir sus preguntas antes de visitar al American Express. O Los medicamentos de [place] para tos y resfriados pueden contener alcohol u otros ingredientes  que se deben Theatre manager. Consulte con su mdico sobre los Express Scripts, hierbas o medicamentos de venta libre que est tomando o puede tomar durante el Psychiatrist.   P La actividad fsica durante el embarazo puede beneficiarla tanto a usted y a su beb al disminuir la incomodidad y la fatiga produciendo una sensacin de [place], y el aumento de la probabilidad de una pronta recuperacin despus del parto. El ejercicio ligero a moderado Academic librarian fortalece el vientre (abdomen) y msculos de la espalda. Esto le ayuda a Banker. Practicar yoga, caminar, nadar y montar en una bicicleta fija son por lo general ejercicios seguros para las mujeres embarazadas. Evite el buceo, ejercicios de gran altura (ms de 3000 pies), esqu, paseos a caballo, deportes de contacto, etc. Consulte siempre con su mdico antes de comenzar cualquier tipo de ejercicio, Especialmente durante el embarazo y, especialmente si no hacan ejercicios antes de quedar embarazada. Q Las nuseas y Dentist estomacal son comunes durante el Psychiatrist. Coma un par de galletitas o tostadas secas antes de levantarse de la cama. Los alimentos que normalmente le gustan pueden hacerla sentir mal del [place]. Puede que tenga que sustituir otros alimentos nutritivos. Comer cinco o seis porciones pequeas al Geophysical data processor de tres grandes pueden hacer que usted se sienta mejor. No beba con sus alimentos, beba Charter Communications. Las preguntas Que usted tiene deben estar escritas y consultadas durante sus visitas prenatales. R Lea y haga planes para que su casa sea segura para el beb. Hay consejos importantes para que su hogar sea un ambiente ms seguro para su beb. Revise los consejos y haga su hogar ms seguro para usted y su beb. Lea las etiquetas de los alimentos con respecto a las caloras, sal y Owens[employer]Illinois. S Las salas de saunas, baeras de [place] caliente y vapor deben evitarse durante Teacher, English as a foreign language. El calor excesivo puede ser perjudicial durante el [place]. Su mdico la examinar para Hotel manager de transmisin sexual y trastornos genticos durante las visitas prenatales. Aprenda los Signos del [place] de [place]. Las relaciones Sexuales durante el Psychiatrist son seguras a menos que haya un problema mdico o del Psychiatrist y su mdico le recomiende evitarlas. T Se debe evitar viajar largas distancias, especialmente en el tercer trimestre de su embarazo.  Si tiene que viajar afuera del [place], asegrese de llevar una copia de sus registros mdicos y un plan de seguro mdico. Usted no debe recorrer largas distancias sin consultar con su mdico primero. La mayora de las campaas areas no permiten viajar despus de 36 semanas de embarazo. La Toxoplasmosis es una infeccin causada por un parsito que puede daar seriamente al beb nonato. Evite comer carne poco cocida y tocar la arena higinica para gatos. Asegrese de usar guantes para la Automotive engineer. El hormigueo de las manos y los dedos no es inusual y se debe a la retencin de lquidos. Esto desaparece despus del nacimiento del beb. U Aumenta el tamao del tero durante Designer, fashion/clothing. Sus riones comenzarn a funcionar ms eficientemente. Esto puede hacer que Usted sienta la necesidad de orinar ms a menudo. Tambin puede tener fugas de orina al estornudar, toser o rer. Esto se debe a que el crecimiento del tero presiona la vejiga, que se encuentra directamente adelante y ligeramente por debajo del tero durante los primeros meses del [place]. Si experimenta ardor con frecuencia al Beatrix Shipper o en la orina presenta sangre, asegrese de decirle a su mdico. El tamao de su tero en el tercer trimestre puede causar un problema con el equilibro. Es recomendable mantener una buena postura y Automotive engineer usar tacones altos durante ese tiempo. Un Ultrasonido de su beb puede ser necesario durante el embarazo y es seguro para usted y su  beb. V Las vacunas son Neomia Dear preocupacin importante para las mujeres embarazadas. Pngase las vacunas necesarias antes del embarazo. El centro para el control de enfermedades ([email or web address]) tiene directrices claras para el uso de vacunas durante el [place]. Revise la lista, augrese de Science writer con su mdico. Las Vitaminas prenatales son tiles y saludables para usted y el beb. No tome otras vitaminas, excepto lo que se recomiende. Tomar algunas vitaminas en exceso puede causar problemas de sobredosis. Los vmitos continuos deben ser reportados a su mdico. Las venas Varicosas pueden aparecer sobre todo si hay antecedentes familiares. Deben desaparecer despus del nacimiento del beb. Usar calzas ayuda si no le producen molestias. W Tener sobrepeso o bajo peso durante el embarazo puede causarle problemas. Trate de estar en un rango de 15 libras de su peso ideal antes del Psychiatrist. Recuerde, el embarazo no es el momento para estar a dieta! No deje de comer o saltee las comidas si aumenta de [place]. Tanto usted como su beb necesitan las caloras y la nutricin que recibe de una dieta saludable. Asegrese de Science writer con su mdico acerca de su dieta. Hay un plan de frmula y dieta disponibles en funcin de si tiene sobrepeso o bajo peso. Su mdico o nutricionista puede ayudarle y asesorarle si es necesario. X Evite los rayos X. Si usted tiene que hacerse un tratamiento dental o pruebas diagnsticas, informe a su dentista o mdico que est embarazada para eventualmente tomar cuidados extra. Los rayos X slo se deben tomar The Sher[employer]Williams de no tomarlos son mayores que el riesgo de tomarlos. Si es necesario, slo una cantidad mnima de radiacin debe ser [place]. Cuando los rayos X son necesarios, se deben usar los escudos protectores de plomo para cubrir las reas del cuerpo que no necesiten visualizarse en la radiografa. Y Su beb la ama. Amamantar a su beb crea un vnculo amoroso y Manpower Inc. Dle a su beb un medio ambiente sano para vivir durante el embarazo. Los bebs y nios requieren un cuidado y [place] orientacin constante.  Su salud y su seguridad deben ser vigiladas cuidadosamente en todo momento. Despus de que nazca el beb, descanse o duerma una siesta cuando el beb est durmiendo. Z Consiga descansar. Asegrese de obtener suficiente descanso. Descanse de lado tan a menudo como sea posible, especialmente se aconseja el lado izquierdo. Proporciona la mejor circulacin para su beb y ayuda a reducir la hinchazn. Trate de tomar una siesta de treinta a cuarenta y cinco minutos en la tarde cuando sea posible. Despus de que nazca el beb, descanse o duerma una siesta cuando el beb est durmiendo. Trate de elevar los pies cuando le sea posible. Ayuda a la circulacin en las piernas y a Building services engineer. La mayora de informacin es de cortesa de CDC. Document Released: 04/09/2007 Document Revised: 06/01/2011 [facility] Patient Information 2012 [place], [place].Embarazo - Primer trimestre (Pregnancy - First Trimester) Durante el acto sexual, millones de espermatozoides ingresan a la vagina. Slo Education administrator y Economist en vulo femenino mientras se encuentre en las Trompas de [place]. Una semana despus, el huevo fertilizado se implanta en la pared del tero. Un embrin comienza a desarrollarse y se convierte en un beb. Entre la 6 y la 8 semanas se forman los ojos y el rostro y el latido cardaco se detecta con Nurse, adult. Hacia el final de la 12 semana (primer trimestre) se han formado todos los rganos del beb. Ahora que est embarazada, desear hacer todo lo posible para tener un beb sano. Dos de las cosas ms importantes son: Winferd Humphrey buena atencin prenatal y seguir las indicaciones del profesional que la asiste. La atencin prenatal incluye toda la asistencia mdica que usted recibe antes del nacimiento del beb. Se lleva a cabo para prevenir problemas durante el  embarazo y [place]. EXAMENES PRENATALES  Durante los controles prenatales, le tomarn la presin arterial, verificarn su peso y [place] harn [place] de [place]. Todo esto se realiza para asegurar que usted progresa de [place] normal y Office manager.   Una mujer embarazada ganar de 25 a 35 libras (8 a 12 kilos) Academic librarian. Sin embargo, si tiene sobrepeso o bajo peso el mdico le aconsejar acerca de [place].   Entre los Winn-Dixie solicitarn anlisis de [place], cultivos de cuello de [place], [place] y [place]. Estas pruebas se realizan para controlar su salud y la del beb. Se recomienda realizar todos los ARAMARK Corporation y tambin pruebas para la deteccin del VIH, si usted lo autoriza. Este es el virus que causa el [place]. Estas pruebas se realizan porque existen medicamentos que podrn administrarle para prevenir que el beb nazca con esta infeccin, si usted estuviera infectada y no lo supiera. Tambin se solicitan anlisis de sangre para conocer su grupo sanguneo, infecciones previas y para Chief Operating Officer los glbulos rojos (hemoglobina).   Un bajo nivel de hemoglobina (anemia) es frecuente durante el embarazo. Para prevenirla, se administran hierro y vitaminas. En etapas posteriores del [employer]gett [employer]att solicitarn anlisis de sangre para descartar diabetes y otros anlisis para Sales promotion account executive otros problemas. Los anlisis son necesarios para verificar que usted y el beb estn bien.   Necesitar otras pruebas que se realizan para asegurarse de que el beb est saludable.  CAMBIOS DURANTE EL PRIMER TRIMESTRE (LOS TRES PRIMEROS MESES DEL EMBARAZO). Su organismo atravesar numerosos cambios Academic librarian. Estos pueden variar de Neomia Dear persona a otra. Converse con el profesional que la asiste acerca los cambios que usted note y que la preocupen.  El perodo menstrual se detiene.  El vulo y los espermatozoides llevan los genes que determinan cmo seremos. Sus genes y los  de su pareja forman un beb. Los genes del varn determinan si ser un nio o una nia.   No tome ningn medicamento a menos que se lo haya indicado el profesional que la asiste.   Aumentar la circunferencia de su cuerpo y se sentir hinchada.   Podr sentir nuseas o vmitos. Si los vmitos son incontrolables, comunquese con el profesional que la asiste.   Sus mamas comenzarn a agrandarse y estarn ms sensibles.   Los pezones salen hacia afuera y se oscurecen.   Mayor necesidad de Geographical information systems officer. Si siente dolor al orinar podra tener una infeccin urinaria.   Cansarse fcilmente.   Prdida del apetito.   Retorcijones por ciertos tipos de alimentos.   Al principio, podr ganar o perder algo de peso.   Podr sentir Warden/ranger (alegra por Firefighter o preocupacin acerca de que haya algo mal en el embarazo o con el beb).   Podr tener sueos ms vvidos y fuertes.  INSTRUCCIONES PARA EL CUIDADO DOMICILIARIO  Es extremadamente importante que evite el cigarrillo, el alcohol y las drogas no prescriptas durante el Psychiatrist. Estas sustancias qumicas afectan la formacin y el desarrollo del beb. Evite estas sustancias qumicas durante todo el embarazo para asegurar el nacimiento de un beb sano.   Comience las consultas prenatales alrededor de la 12 semana de [place]. Generalmente se programan cada mes al principio y se hacen ms frecuentes en los 2 ltimos meses antes del parto. Cumpla con las citas tal como se le indic. Siga las indicaciones del profesional con respecto a los medicamentos que toma, los anlisis de laboratorio, la actividad fsica y Psychologist, forensic.   Durante el embarazo debe obtener nutrientes para usted y para su beb. Consuma alimentos balanceados. Elija alimentos como carne, pescado, [place] y otros productos lcteos descremados, vegetales, frutas, panes integrales y cereales. El Equities trader cul es el aumento de peso ideal.   Las nuseas  matinales pueden aliviarse si come algunas galletitas saladas en la cama. Coma dos galletitas antes de levantarse por la maana. Tambin puede comer galletitas sin sal.   Tome 4  5 comidas pequeas por Geophysical data processor de 3 comidas abundantes para evitar las nuseas y los vmitos.   Tambin puede prevenir las nuseas bebiendo lquidos entre comidas en lugar de [place] come.   Si no tiene problemas, puede continuar Calpine Corporation a lo largo de todo Firefighter. Algunos problemas pueden ser la prdida prematura de lquido amnitico de las Startex, hemorragias vaginales o dolor en el abdomen.   Realice [employer], si no tiene restricciones. Consulte con el profesional que la asiste o con un fisioterapeuta si no sabe con certeza si determinados ejercicios son seguros. En los dos ltimos trimestres del embarazo puede ocurrir un aumento de [place]. La actividad fsica ayudar a:   Engineering geologist.   Mantenerse en forma.   Prepararse para el parto y [place] de [place].   Ayuda a perder el peso ganado en el embarazo luego del [place].   Use un buen sostn o como los que se usan para hacer deportes para Paramedic la sensibilidad de las [place]. Tambin puede serle til si lo [place] mientras duerme.   Consulte cuando tendr el curso preparatorio para Engineer, manufacturing systems. Comience las clases cuando se las ofrezcan.   No utilice la baera con agua caliente, baos  turcos y saunas.   Colquese el cinturn de seguridad cuando conduzca. Este la proteger a usted y al beb en caso de accidente.   Evite comer carne cruda y el contacto con los utensilios y desperdicios de los gatos. Estos elementos contienen grmenes que pueden causar defectos de nacimiento en el beb.   El primer trimestre es un buen momento para visitar a su dentista y Software engineer. Es importante mantener los dientes limpios. Use un cepillo de dientes suave y cepllese con ms suavidad durante el  [employer].   Pida ayuda si tiene necesidades econmicas, de asesoramiento o nutricionales durante el [place]. El profesional podr ayudarla con respecto a estas necesidades, o derivarla a otros especialistas.   No tome ningn medicamento a menos que se lo haya indicado el profesional que la asiste.   Informe al profesional que lo asiste si es vctima de algn tipo de Advertising copywriter, ya sea mental o fsica.   Haga una lista de nmeros de telfono de Associate Professor de familiares, amigos, hospital, polica y bomberos.   Anote sus preguntas. Llvelas con usted en su prxima visita de control.   No utilice duchas vaginales.   No cruce las piernas.   Si ha estado parada durante largos perodos de [place], mueva los pies o realice pequeos pasos en crculo.   Podr tener secreciones vaginales que pueden requerir una compresa o [place]. No utilice tampones ni compresas con perfume.  EL CONSUMO DE MEDICAMENTOS Y DROGAS DURANTE EL EMBARAZO  Tome las vitaminas apropiadas para esta etapa tal como se le indic. Las vitaminas deben contener 1 miligramo de cido flico. Guarde todas las vitaminas fuera del alcance de los nios. La ingestin de slo un par de vitaminas o tabletas que contengan hierro pueden ocasionar la [employer] en un beb o en un nio pequeo.   Evite el uso de medicamentos, inclusive hierbas, que no hayan sido prescriptos o indicados por el profesional que la asiste. Utilice los medicamentos de venta libre o de prescripcin para Chief Technology Officer, Environmental health practitioner o la [place], segn se lo indique el profesional que lo asiste. No utilice aspirina, ibuprofeno o naproxeno a menos que el profesional la autorice.   El consumo de alcohol se relaciona con ciertos defectos de nacimiento. Entre ellos el sndrome de alcoholismo fetal. Debe evitar el consumo de alcohol en cualquiera de sus formas. El cigarrillo causa nacimientos prematuros y bebs de [place].   Las drogas ocasionan terribles daos al beb.  Estn absolutamente prohibidas. Un beb que nace de [employer], ser adicto al nacer. Ese beb tendr los mismos sntomas de abstinencia que un adulto.   No consuma drogas. Pueden daar gravemente al beb.   Converse con el mdico acerca de cualquier medicamento que est tomando y la razn por la cual los toma.  EL ABORTO ESPONTNEO ES UNA SITUACIN FRECUENTE Esto no significa que ha hecho las cosas mal. No es un motivo para preocuparse en caso de un nuevo embarazo. El profesional que la asiste la ayudar si tiene preguntas para formular. Si ha sufrido un aborto espontneo, Pension scheme manager someterse a Futures trader de curetaje, SOLICITE ATENCIN MDICA SI: Tiene alguna preocupacin durante el embarazo. Es mejor que llame para formular las preguntas si no puede esperar hasta la prxima visita, que sentirse preocupada por ellas. SOLICITE ATENCIN MDICA DE INMEDIATO SI:  La temperatura oral se eleva sin motivo por encima de 102 F (38.9 C) o segn le indique el profesional que lo asiste.  Tiene una prdida de lquido por la vagina (canal de parto). Si sospecha una ruptura de las [place], tmese la temperatura y llame al profesional para informarlo sobre esto.   Observa unas pequeas manchas o una hemorragia vaginal. Notifique al profesional acerca de la cantidad y de cuntos apsitos est utilizando.   Presenta un olor desagradable en la secrecin vaginal y observa un cambio en el color.   Contina con las nuseas y no obtiene alivio de los remedios indicados. Vomita sangre o algo similar a la borra del caf.   Pierde ms de 1 Kg de peso en [employer].   Aumenta ms de 1 Kg en una semana y [address], las manos, los pies o las piernas hinchados.   Aumenta ms de 2,5 Kg en una semana (aunque no tenga las manos, pies, piernas o el rostro hinchados).   Ha estado expuesta a la rubola y no ha sufrido la enfermedad. Ha estado expuesta a la quinta enfermedad o a la varicela.   Ha  estado expuesta a la quinta enfermedad o a la varicela.   Presenta dolor abdominal. Las molestias en el ligamento redondo son Neomia Dear causa benigna (no cancerosa) frecuente de dolor abdominal durante el embarazo. El profesional que la asiste deber evaluarla.   Presenta dolor de Renne Musca, diarrea, dolor al orinar o le falta la respiracin.   Sufre una cada, un accidente automovilstico o algn traumatismo.   Sufre violencia fsica o mental en su hogar.  Document Released: 03/22/2005 Document Revised: 06/01/2011 [facility] Patient Information 2012 [place], [place].

## 2012-03-07 ENCOUNTER — Encounter: Payer: Self-pay | Admitting: Family Medicine

## 2012-03-20 ENCOUNTER — Other Ambulatory Visit: Payer: Self-pay

## 2012-03-20 DIAGNOSIS — E018 Other iodine-deficiency related thyroid disorders and allied conditions: Secondary | ICD-10-CM

## 2012-03-20 LAB — TSH: TSH: 0.175 u[IU]/mL — ABNORMAL LOW (ref 0.350–4.500)

## 2012-03-20 NOTE — Progress Notes (Signed)
TSH DONE TODAY Sandra [name] 

## 2012-04-01 NOTE — Progress Notes (Signed)
Note reviewed.  Agree with plan. 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 H/o c-section then VBAC.  Will need delivery planning at 30 weeks with OB service.

## 2012-04-02 ENCOUNTER — Ambulatory Visit (INDEPENDENT_AMBULATORY_CARE_PROVIDER_SITE_OTHER): Payer: Self-pay | Admitting: Family Medicine

## 2012-04-02 VITALS — BP 111/72 | Wt 163.0 lb

## 2012-04-02 DIAGNOSIS — Z331 Pregnant state, incidental: Secondary | ICD-10-CM

## 2012-04-02 DIAGNOSIS — Z3480 Encounter for supervision of other normal pregnancy, unspecified trimester: Secondary | ICD-10-CM

## 2012-04-02 MED ORDER — LEVOTHYROXINE SODIUM 150 MCG PO TABS: ORAL_TABLET | ORAL | Status: DC

## 2012-04-02 NOTE — Patient Instructions (Addendum)
Fue un placer verle hoy; tiene 17 semanas con 1 dia hoy.   Estoy haciendole un referido para Equities trader ("Anatomy Scan"), que normalmente sehace entre 18 y 20 semanas de Psychiatrist.  Para la tiroide, estoy bajandole la dosis de la levotiroxina a todos los dias MENOS los lunes y viernes,cuando debe tomar 2 tabletas ( ).  Volvemos a chequearle el estudio de sangre en 3 semanas (por 31 de [place]).  Debe volver para su proxima cita prenatal en 4 semanas.

## 2012-04-03 ENCOUNTER — Encounter: Payer: Self-pay | Admitting: Family Medicine

## 2012-04-03 NOTE — Progress Notes (Signed)
Patient seen at [redacted]w[redacted]d for routine prenatal followup.  She denies feeling nausea; has some days with excessive fatigue after 4pm.  Is taking LT4 as directed (we increased dose earlier in pregnancy).  Denies symptoms that are more classic for hyperthyroidism, such as heat intolerance or palpitations.  Feels emotionally to be doing well.  Reviewed prior delivery history; had LTCS followed by successful VBAC.  WIll counsel to see OB for delivery planning around 32 weeks for discussion of another VBAC.  Clarified BW history of her two children.   Plan to continue PNVs; will adjust her LT4 down slightly and recheck in another 4 weeks.  Anatomy scan.  Flu shot. Follow up in 4 more weeks (TSH check at that time).  Paula Compton, MD

## 2012-04-16 ENCOUNTER — Other Ambulatory Visit: Payer: Self-pay | Admitting: *Deleted

## 2012-04-16 DIAGNOSIS — Z3689 Encounter for other specified antenatal screening: Secondary | ICD-10-CM

## 2012-04-18 ENCOUNTER — Ambulatory Visit (HOSPITAL_COMMUNITY)
Admission: RE | Admit: 2012-04-18 | Discharge: 2012-04-18 | Disposition: A | Discharge status: Home / Self Care | Payer: Self-pay | Source: Ambulatory Visit | Attending: Family Medicine | Admitting: Family Medicine

## 2012-04-18 DIAGNOSIS — E079 Disorder of thyroid, unspecified: Secondary | ICD-10-CM | POA: Insufficient documentation

## 2012-04-18 DIAGNOSIS — E039 Hypothyroidism, unspecified: Secondary | ICD-10-CM | POA: Insufficient documentation

## 2012-04-18 DIAGNOSIS — O358XX0 Maternal care for other (suspected) fetal abnormality and damage, not applicable or unspecified: Secondary | ICD-10-CM | POA: Insufficient documentation

## 2012-04-18 DIAGNOSIS — Z3689 Encounter for other specified antenatal screening: Secondary | ICD-10-CM

## 2012-04-18 DIAGNOSIS — Z1389 Encounter for screening for other disorder: Secondary | ICD-10-CM | POA: Insufficient documentation

## 2012-04-18 DIAGNOSIS — O34219 Maternal care for unspecified type scar from previous cesarean delivery: Secondary | ICD-10-CM | POA: Insufficient documentation

## 2012-04-18 DIAGNOSIS — Z363 Encounter for antenatal screening for malformations: Secondary | ICD-10-CM | POA: Insufficient documentation

## 2012-04-19 ENCOUNTER — Encounter: Payer: Self-pay | Admitting: Family Medicine

## 2012-04-29 ENCOUNTER — Telehealth: Payer: Self-pay | Admitting: Family Medicine

## 2012-04-29 NOTE — Telephone Encounter (Signed)
Pregnant patients on levothyroxine should have TSH checked one time every 4 weeks.  I am including Dr. Louanne Belton on this note as well, as he saw the patient for her initial OB visit. Paula Compton, MD

## 2012-04-29 NOTE — Telephone Encounter (Signed)
Pt called and stated needs THS every month by Dr instructions. Could you please confirm if this inf is correct and I will set up an appt for pt's Lab.  MJ

## 2012-04-29 NOTE — Telephone Encounter (Signed)
Will fwd. To Dr.Breen to clarify. Lorenda Hatchet, Renato Battles

## 2012-04-30 ENCOUNTER — Telehealth: Payer: Self-pay | Admitting: Family Medicine

## 2012-04-30 DIAGNOSIS — E039 Hypothyroidism, unspecified: Secondary | ICD-10-CM

## 2012-04-30 NOTE — Telephone Encounter (Signed)
Please make a lab order TSH ordered by Dr. Mauricio Po  Thank You

## 2012-05-01 ENCOUNTER — Other Ambulatory Visit: Payer: Self-pay

## 2012-05-01 DIAGNOSIS — E039 Hypothyroidism, unspecified: Secondary | ICD-10-CM

## 2012-05-01 LAB — TSH: TSH: 0.14 u[IU]/mL — ABNORMAL LOW (ref 0.350–4.500)

## 2012-05-01 NOTE — Progress Notes (Signed)
TSH DONE TODAY Sandra [name] 

## 2012-05-03 ENCOUNTER — Encounter: Payer: Self-pay | Admitting: Family Medicine

## 2012-05-07 ENCOUNTER — Encounter: Payer: Self-pay | Admitting: Family Medicine

## 2012-05-07 ENCOUNTER — Ambulatory Visit (INDEPENDENT_AMBULATORY_CARE_PROVIDER_SITE_OTHER): Payer: No Typology Code available for payment source | Admitting: Family Medicine

## 2012-05-07 DIAGNOSIS — Z3480 Encounter for supervision of other normal pregnancy, unspecified trimester: Secondary | ICD-10-CM

## 2012-05-07 NOTE — Progress Notes (Signed)
Pt seen at [redacted]w[redacted]d for routine prenatal follow up. She denies any n/v. Is taking levothyroxine as directed and had her TSH checked last week, which came back normal. Denies any palpitations or heat intolerance. She has no complaints or concerns today.  Discussed [place] results with patient, which were normal but noted an echogenic focus in LV of fetal heart. Reassured patient that although this is an abnormal finding, we will be sure to keep a close eye on it and many times, there is no adverse consequences when the baby is born.  Educated patient on different classes she can take at the [facility] and she is interested in them.  Also, instructed pt if she was concerned about something to the point she felt she needed to go to the ER for any reason, to go to the [facility] ER at this point.  Will recheck her TSH in 1 month.

## 2012-05-07 NOTE — Progress Notes (Signed)
No complaints.  No bleeding.  TSH appropriate. [place] reviewed, echogenic focus LV, no other abnormalities.  A/P: 22.1 week, doing well.  Return 1 month for TSH recheck.

## 2012-05-07 NOTE — Patient Instructions (Addendum)
Pregnancy - Second Trimester The second trimester is the period between 13 to 27 weeks of your pregnancy. It is important to follow your doctor's instructions. HOME CARE   Do not smoke.  Do not drink alcohol or use drugs.  Only take medicine as told by your doctor.  Take prenatal vitamins as told. The vitamin should contain 1 milligram of folic acid.  Exercise.  Eat healthy foods. Eat regular, well-balanced meals.  You can have sex (intercourse) if there are no other problems with the pregnancy.  Do not use hot tubs, steam rooms, or saunas.  Wear a seat belt while driving.  Avoid raw meat, uncooked cheese, and litter boxes and soil used by cats.  Visit your dentist. Cleanings are okay. GET HELP RIGHT AWAY IF:   You have a temperature by mouth above 102 F (38.9 C), not controlled by medicine.  Fluid is coming from your vagina.  Blood is coming from your vagina. Light spotting is common, especially after sex (intercourse).  You have a bad smelling fluid (discharge) coming from the vagina. The fluid changes from clear to white.  You still feel sick to your stomach (nauseous).  You throw up (vomit) blood.  You lose or gain more than 2 pounds (0.9 kilograms) of weight in a week, or as suggested by your doctor.  Your face, hands, feet, or legs get puffy (swell).  You get exposed to German measles and have never had them.  You get exposed to fifth disease or chickenpox.  You have belly (abdominal) pain.  You have a bad headache that will not go away.  You have watery poop (diarrhea), pain when you pee (urinate), or have shortness of breath.  You start to have problems seeing (blurry or double vision).  You fall, are in a car accident, or have any kind of trauma.  There is mental or physical violence at home.  You have any concerns or worries during your pregnancy. MAKE SURE YOU:   Understand these instructions.  Will watch your condition.  Will get help  right away if you are not doing well or get worse. Document Released: 09/06/2009 Document Revised: 09/04/2011 Document Reviewed: 09/06/2009 ExitCare Patient Information 2013 [employer]Care, [employer].  

## 2012-05-31 ENCOUNTER — Telehealth: Payer: Self-pay | Admitting: Family Medicine

## 2012-05-31 DIAGNOSIS — E039 Hypothyroidism, unspecified: Secondary | ICD-10-CM

## 2012-05-31 NOTE — Telephone Encounter (Signed)
Pt needs to check TSH please could you put the order and let me know.  Thank You  [place]

## 2012-06-05 ENCOUNTER — Ambulatory Visit (INDEPENDENT_AMBULATORY_CARE_PROVIDER_SITE_OTHER): Payer: No Typology Code available for payment source | Admitting: Family Medicine

## 2012-06-05 VITALS — BP 104/67 | Wt 171.0 lb

## 2012-06-05 DIAGNOSIS — E018 Other iodine-deficiency related thyroid disorders and allied conditions: Secondary | ICD-10-CM

## 2012-06-05 DIAGNOSIS — Z331 Pregnant state, incidental: Secondary | ICD-10-CM

## 2012-06-05 DIAGNOSIS — Z3490 Encounter for supervision of normal pregnancy, unspecified, unspecified trimester: Secondary | ICD-10-CM

## 2012-06-05 LAB — HIV ANTIBODY (ROUTINE TESTING W REFLEX): HIV: NONREACTIVE

## 2012-06-05 LAB — CBC
Hemoglobin: 11 g/dL — ABNORMAL LOW (ref 12.0–15.0)
MCH: 31.9 pg (ref 26.0–34.0)
RBC: 3.45 MIL/uL — ABNORMAL LOW (ref 3.87–5.11)

## 2012-06-05 LAB — GLUCOSE, CAPILLARY: Comment 1: 1

## 2012-06-05 NOTE — Patient Instructions (Addendum)
Fue un placer verle hoy; esta' con 79 semanas y 2 dias de Psychiatrist.   Siga tomando la medicina para la tiroide, tanto como la vitamina prenatal, a diario.  Estamos chequeando laboratorios para la tiroide, el conteo de [place], la [place], y VIH hoy como es comun en esta etapa del [place].  Proxima cita en 2 semanas.   Informacion sobre lo que hablamos hoy en la consulta (parto prematuro):  Sport and exercise psychologist  (Preterm Labor) El parto prematuro comienza antes de la semana 37 de [place]. La duracin de un embarazo normal es de 39 a 41 semanas.  CAUSAS  Generalmente no hay una causa que pueda identificarse. Sin embargo, una de las causas conocidas ms frecuentes son las infecciones. Las infecciones del tero, el cuello, la vagina, el lquido [place], la vejiga, los riones y Teacher, adult education de los pulmones (neumona) pueden hacer que el trabajo de parto se inicie. Otras causas son:   Infecciones urogenitales, como infecciones por hongos y vaginosis bacteriana.  Anormalidades uterinas (forma del tero, sptum uterino, fibromas, hemorragias en la placenta).  Un cuello que ha sido operado y se abre prematuramente.  Malformaciones del beb.  Gestaciones mltiples (mellizos, trillizos y ms).  Ruptura del saco amnitico. :Otros factores de riesgo del parto prematuro son   Historia previa de Sport and exercise psychologist.  Ruptura prematura de las [place].  La placenta cubre la apertura del cuello (placenta previa).  La placenta se separa del tero (abrupcin placentaria).  El cuello es demasiado dbil para contener al beb en el tero (cuello incompetente).  Hay mucho lquido en el saco amnitico (polihidramnios).  Consumo de drogas o hbito de fumar durante Firefighter.  No aumentar de peso lo suficiente durante el [employer].  Mujeres menores de 18 aos o [address] de [address] [place].  Nivel socioeconmico bajo.  Raza afroamericana. SNTOMAS  Los signos y sntomas son:   Clicos del tipo  menstrual  Contracciones con un intervalo entre 30 y 70 segundos, comienzan a ser regulares, se hacen ms frecuentes y se hacen ms intensas y dolorosas.  Contracciones que comienzan en la parte superior del tero y se expanden hacia abajo, hacia la zona inferior del abdomen y la espalda.  Sensacin de presin en la pelvis o dolor en la espalda.  Aparece una secrecin acuosa o sanguinolenta por la vagina. DIAGNSTICO  El diagnstico puede confirmarse:   Con un examen vaginal.  Ecografa del cuello.  Muestra (hisopado) de las secreciones crvico-vaginales. Estas muestras se analizan para buscar la presencia de fibronectina fetal. Esta protena que se encuentra en las secreciones del tero y se asocia con el parto prematuro.  Monitoreo fetal TRATAMIENTO  Segn el tiempo del Psychiatrist y otras [place], el mdico puede indicar reposo en cama. Si es necesario, le indicarn medicamentos para TEFL teacher las contracciones y apurar la maduracin de los pulmones del feto. Si el trabajo de parto se inicia antes de las 34 semanas de [place], se recomienda la hospitalizacin. El tratamiento depende de las condiciones en que se encuentre la madre y el beb.  PREVENCIN  Hay algunas cosas que [employer] Financial puede hacer para disminuir el riesgo de trabajo de parto prematuro en futuros [employer]. Una mam puede:   Dejar de fumar.  Mantener un peso saludable y evitar sustancias qumicas y drogas innecesarias.  Controlar todo tipo de infeccin.  Informar al mdico si tiene una historia conocida de parto prematuro. Document Released: 09/19/2007 Document Revised: 09/04/2011 [facility] Patient Information 2013 [place], [place].

## 2012-06-05 NOTE — Progress Notes (Signed)
G4P2 @[redacted]w[redacted]d , here for routine visit.  Visit in Spanish. We revisited the results of anatomy scan, namely the LV intracardiac focus as a "soft marker" for Downs.  Discussed that this finding is not a structural cardiac abnormality and is difficult to correlate with aneuploidy.  No family history of aneuploidy.  Patient did not have genetic testing earlier in the pregnancy.  Good fetal movement.  No discharge or bleeding, no contractions.  Prior LTCS at [facility], followed by successful VBAC.  Discussed plans for OB counseling for delivery planning by VBAC in setting of one prior C/S. This visit for 32-34 weeks.  Repeat 1hGTT today, 28 week labs.  28-week Pregnancy Medical Home Risk Screening tool applied today and negative; PHQ9 applied and scored 4, with 0 points for Q#9.  Follow up in 2 weeks for 28 week visit.  Continue PNV and LT4.  To check TSH today, as well as 28 week labs.  She is going to call [facility] for interpreter at next visit, with Dr Louanne Belton.  Paula Compton, MD

## 2012-06-06 ENCOUNTER — Telehealth: Payer: Self-pay | Admitting: Family Medicine

## 2012-06-06 DIAGNOSIS — E039 Hypothyroidism, unspecified: Secondary | ICD-10-CM

## 2012-06-06 MED ORDER — LEVOTHYROXINE SODIUM 150 MCG PO TABS: ORAL_TABLET | ORAL | Status: DC

## 2012-06-06 NOTE — Telephone Encounter (Signed)
Called patient, call completed in Spanish. Discussed results of TSH lab done yesterday, which indicates need for dose reduction of LT4.  She confirms that she has been taking Synthroid , 2 tablets on Mondays and Fridays, and 1 tablet daily on all other days of the week.  Will reduce to daily for all seven days of the week, and recheck in 4 weeks.   She voices understanding with this plan. Paula Compton, MD

## 2012-07-05 ENCOUNTER — Ambulatory Visit (INDEPENDENT_AMBULATORY_CARE_PROVIDER_SITE_OTHER): Payer: No Typology Code available for payment source | Admitting: Family Medicine

## 2012-07-05 ENCOUNTER — Encounter: Payer: Self-pay | Admitting: Family Medicine

## 2012-07-05 VITALS — BP 106/67 | Wt 176.1 lb

## 2012-07-05 DIAGNOSIS — E018 Other iodine-deficiency related thyroid disorders and allied conditions: Secondary | ICD-10-CM

## 2012-07-05 DIAGNOSIS — Z331 Pregnant state, incidental: Secondary | ICD-10-CM

## 2012-07-05 LAB — TSH: TSH: 0.725 u[IU]/mL (ref 0.350–4.500)

## 2012-07-05 NOTE — Progress Notes (Signed)
Doing well with no complaints.  Patient feels good fetal movement.  No pain/contractions. A/P: TSH today appt at [facility] in 2 weeks for VBAC discussion RTC 4 weeks, TSH at that time Kick counts and preterm labor reviewed

## 2012-07-05 NOTE — Patient Instructions (Signed)
It was great to see you today! We will check your thyroid level today and let you know if we need to change your medications. I would like you to see the OB/GYNs in about 2 weeks to discuss a regular delivery after a c-section. Come back to see me in 4 weeks.

## 2012-07-08 ENCOUNTER — Encounter: Payer: Self-pay | Admitting: Family Medicine

## 2012-07-15 ENCOUNTER — Encounter: Payer: Self-pay | Admitting: Family Medicine

## 2012-07-15 DIAGNOSIS — O34219 Maternal care for unspecified type scar from previous cesarean delivery: Secondary | ICD-10-CM | POA: Insufficient documentation

## 2012-07-15 DIAGNOSIS — E018 Other iodine-deficiency related thyroid disorders and allied conditions: Secondary | ICD-10-CM

## 2012-07-15 NOTE — Progress Notes (Unsigned)
Patient ID: Sandra [name], female   DOB: Jan 13, 1981, 32 y.o.   MRN: [id number] 32 y.o. at [redacted]w[redacted]d with Estimated Date of Delivery: 09/09/12 was seen today in office to discuss VBAC versus repeat cesarean section.   The following risks were discussed with the patient.  Risk of uterine rupture at term is 0.78 percent with TOLAC and 0.22 percent with ERCD. 1 in 10 uterine ruptures will result in neonatal death or neurological injury. The benefits of a trial of labor after cesarean (TOLAC) resulting in a vaginal birth after cesarean (VBAC) include the following: shorter length of hospital stay and postpartum recovery (in most cases); fewer complications, such as postpartum fever, wound or uterine infection, thromboembolism (blood clots in the leg or lung), need for blood transfusion and fewer neonatal breathing problems. The risks of an attempted VBAC or TOLAC include the following: Risk of failed trial of labor after cesarean (TOLAC) without a vaginal birth after cesarean (VBAC) resulting in repeat cesarean delivery (RCD) in about 20 to 40 percent of women who attempt VBAC.  Risk of rupture of uterus resulting in an emergency cesarean delivery. The risk of uterine rupture may be related in part to the type of uterine incision made during the first cesarean delivery. A previous transverse uterine incision has the lowest risk of rupture (0.2 to 1.5 percent risk). Vertical or T-shaped uterine incisions have a higher risk of uterine rupture (4 to 9 percent risk)The risk of fetal death is very low with both VBAC and elective repeat cesarean delivery (ERCD), but the likelihood of fetal death is higher with VBAC than with ERCD. Maternal death is very rare with either type of delivery.  The risks of a repeat cesarean section were reviewed with the patient including but not limited to: 07/998 risk of uterine rupture which could have serious consequences, bleeding which may require transfusion; infection which may  require antibiotics; injury to bowel, bladder or other surrounding organs (bowel, bladder, ureters); injury to the fetus; need for additional procedures including hysterectomy in the event of a life-threatening hemorrhage; thromboembolic phenomenon, incisional problems and other postoperative/anesthesia complications.    Patient also desires bilateral tubal ligation at the time of R C/S, also discussed the additional risks of regret, failure rate of 0.5-1%  with increased risk of ectopic gestation and permanent and irreversible nature of the procedure.  Other forms of reversible BCM discussed, also discussed vasectomy, patient desires BTL.  All her questions answered and she signed a consent indicating a preference for repeat cesarean section/TOLAC.

## 2012-07-17 ENCOUNTER — Encounter: Payer: Self-pay | Admitting: *Deleted

## 2012-08-02 ENCOUNTER — Ambulatory Visit (INDEPENDENT_AMBULATORY_CARE_PROVIDER_SITE_OTHER): Payer: Self-pay | Admitting: Family Medicine

## 2012-08-02 VITALS — BP 110/63 | Temp 98.1°F | Wt 182.8 lb

## 2012-08-02 DIAGNOSIS — E018 Other iodine-deficiency related thyroid disorders and allied conditions: Secondary | ICD-10-CM

## 2012-08-02 DIAGNOSIS — Z3480 Encounter for supervision of other normal pregnancy, unspecified trimester: Secondary | ICD-10-CM

## 2012-08-02 NOTE — Progress Notes (Signed)
No concerns.  Good fetal movement with no bleeding, pain/contractions.  Has signed TOLAC papers. A/P: TSH today RTC 2 weeks, GBS at that time

## 2012-08-02 NOTE — Patient Instructions (Signed)
It was great to see you today! Plan on coming back in about 2 weeks.

## 2012-08-05 ENCOUNTER — Telehealth: Payer: Self-pay | Admitting: *Deleted

## 2012-08-05 NOTE — Telephone Encounter (Signed)
Message copied by Farrell Ours on Mon Aug 05, 2012  1:37 PM ------      Message from: [facility], [place]      Created: Mon Aug 05, 2012 12:46 PM       Could you please let the patient know that we do not need to change her thyroid medication.  Her TSH is where it should be.            Thanks!            Rolm Gala            ----- Message -----         From: [facility] Interface         Sent: 08/02/2012   8:41 PM           To: Brent Bulla, MD                   ------

## 2012-08-06 NOTE — Telephone Encounter (Signed)
Pt is aware about TSH.  MJ

## 2012-08-19 ENCOUNTER — Ambulatory Visit (INDEPENDENT_AMBULATORY_CARE_PROVIDER_SITE_OTHER): Payer: Self-pay | Admitting: Family Medicine

## 2012-08-19 VITALS — BP 106/81 | Temp 98.7°F | Wt 186.9 lb

## 2012-08-19 DIAGNOSIS — Z3483 Encounter for supervision of other normal pregnancy, third trimester: Secondary | ICD-10-CM

## 2012-08-19 DIAGNOSIS — Z3480 Encounter for supervision of other normal pregnancy, unspecified trimester: Secondary | ICD-10-CM

## 2012-08-19 NOTE — Progress Notes (Signed)
Has been having some LE swelling and swelling in hands.  No RUQ pain, n/v, headaches, or visual changes.  Good fetal movement.  Exam: Per flowsheet.  Does have 1+ edema to mid calf, bp not elevated.  A/P: GBS/GC/Chl obtained today RTC next week, TSH and cervical check at that time

## 2012-08-19 NOTE — Patient Instructions (Signed)
It was great to see you today! Make sure you know where [facility] is and have some type of transportation to get there. Come back to see [place] next week.

## 2012-08-22 ENCOUNTER — Encounter: Payer: Self-pay | Admitting: Family Medicine

## 2012-08-28 ENCOUNTER — Ambulatory Visit (INDEPENDENT_AMBULATORY_CARE_PROVIDER_SITE_OTHER): Payer: Self-pay | Admitting: Family Medicine

## 2012-08-28 VITALS — BP 116/59 | Temp 99.0°F | Wt 184.0 lb

## 2012-08-28 DIAGNOSIS — Z3483 Encounter for supervision of other normal pregnancy, third trimester: Secondary | ICD-10-CM

## 2012-08-28 DIAGNOSIS — Z3480 Encounter for supervision of other normal pregnancy, unspecified trimester: Secondary | ICD-10-CM

## 2012-08-28 NOTE — Assessment & Plan Note (Signed)
32 year old Hispanic G4P2012 at 38.[redacted] weeks gestation. - Reviewed cervical cultures from last visit.  - No red flags or concerns at this time  - For itching, recommended Aveeno lotion BID  - No rash appreciated on exam, will hold off on any anti-histamines or topical steroids for now  - Patient to call if itching worsens  - She is having a boy  - Pediatrician: [facility]  - Plans to start OCP after delivery  - Red flags reviewed with patient per AVS  - Return to clinic in ONE week

## 2012-08-28 NOTE — Progress Notes (Signed)
Still complains of swelling of LE and hands bilaterally.  Denies HA, abdominal pain, nausea/vomiting, or blurry vision.  BP within normal limits.  She does complain of generalized itching that started 3 weeks ago.    Exam: per vitals and notes      MSK: 1+ pitting edema ankles bilaterally; no skin breakdown    Skin: no rash, urticaria or plaques, but skin is dry all over  A/P: 32 year old Hispanic G4P2012 at 38.[redacted] weeks gestation - No red flags or concerns at this time - For itching, recommended Aveeno lotion BID - No rash appreciated on exam, will hold off on any anti-histamines or topical steroids for now - Patient to call if itching worsens - She is having a boy - Pediatrician: [facility] - Plans to start OCP after delivery - Red flags reviewed with patient per AVS - Return to clinic in ONE week

## 2012-08-28 NOTE — Patient Instructions (Addendum)
Return to clinic with Dr. Louanne Belton in one week. For itching, it may due to dry skin, try using Aveeno lotion twice per day. If you develop leaking or a gush fluid, vaginal bleeding, or regular contractions, please go to [facility].

## 2012-08-29 ENCOUNTER — Encounter (HOSPITAL_COMMUNITY): Payer: Self-pay | Admitting: Anesthesiology

## 2012-08-29 ENCOUNTER — Inpatient Hospital Stay (HOSPITAL_COMMUNITY): Payer: Medicaid Other | Admitting: Anesthesiology

## 2012-08-29 ENCOUNTER — Encounter (HOSPITAL_COMMUNITY): Payer: Self-pay | Admitting: *Deleted

## 2012-08-29 ENCOUNTER — Inpatient Hospital Stay (HOSPITAL_COMMUNITY)
Admission: AD | Admit: 2012-08-29 | Discharge: 2012-08-31 | DRG: 766 | Disposition: A | Discharge status: Home / Self Care | Payer: Medicaid Other | Source: Ambulatory Visit | Attending: Obstetrics and Gynecology | Admitting: Obstetrics and Gynecology

## 2012-08-29 ENCOUNTER — Encounter (HOSPITAL_COMMUNITY)
Admission: AD | Disposition: A | Discharge status: Home / Self Care | Payer: Self-pay | Source: Ambulatory Visit | Attending: Obstetrics and Gynecology

## 2012-08-29 DIAGNOSIS — E039 Hypothyroidism, unspecified: Secondary | ICD-10-CM | POA: Diagnosis present

## 2012-08-29 DIAGNOSIS — E079 Disorder of thyroid, unspecified: Secondary | ICD-10-CM | POA: Diagnosis present

## 2012-08-29 DIAGNOSIS — O321XX0 Maternal care for breech presentation, not applicable or unspecified: Secondary | ICD-10-CM

## 2012-08-29 DIAGNOSIS — O99284 Endocrine, nutritional and metabolic diseases complicating childbirth: Secondary | ICD-10-CM

## 2012-08-29 DIAGNOSIS — O34219 Maternal care for unspecified type scar from previous cesarean delivery: Secondary | ICD-10-CM | POA: Diagnosis present

## 2012-08-29 DIAGNOSIS — Z98891 History of uterine scar from previous surgery: Secondary | ICD-10-CM

## 2012-08-29 LAB — CBC
MCH: 32.4 pg (ref 26.0–34.0)
MCV: 90.4 fL (ref 78.0–100.0)
Platelets: 257 10*3/uL (ref 150–400)
RDW: 13.7 % (ref 11.5–15.5)

## 2012-08-29 SURGERY — Surgical Case
Anesthesia: Spinal | Site: Abdomen | Wound class: Clean Contaminated

## 2012-08-29 MED ORDER — SCOPOLAMINE 1 MG/3DAYS TD PT72
1.0000 | MEDICATED_PATCH | Freq: Once | TRANSDERMAL | Status: DC
  Administered 2012-08-29: 1.5 mg via TRANSDERMAL

## 2012-08-29 MED ORDER — MORPHINE SULFATE (PF) 0.5 MG/ML IJ SOLN
INTRAMUSCULAR | Status: DC | PRN
  Administered 2012-08-29: .15 mg via INTRATHECAL

## 2012-08-29 MED ORDER — LACTATED RINGERS IV SOLN: INTRAVENOUS | Status: DC

## 2012-08-29 MED ORDER — BISACODYL 10 MG RE SUPP: 10.0000 mg | Freq: Every day | RECTAL | Status: DC | PRN

## 2012-08-29 MED ORDER — MEPERIDINE HCL 25 MG/ML IJ SOLN
6.2500 mg | INTRAMUSCULAR | Status: DC | PRN
  Administered 2012-08-29: 6.25 mg via INTRAVENOUS

## 2012-08-29 MED ORDER — OXYTOCIN 10 UNIT/ML IJ SOLN
INTRAMUSCULAR | Status: AC
  Filled 2012-08-29: qty 4

## 2012-08-29 MED ORDER — NALBUPHINE SYRINGE 5 MG/0.5 ML
5.0000 mg | INJECTION | INTRAMUSCULAR | Status: DC | PRN
Start: 2012-08-29 — End: 2012-08-31
  Filled 2012-08-29: qty 1

## 2012-08-29 MED ORDER — LACTATED RINGERS IV SOLN
INTRAVENOUS | Status: DC | PRN
  Administered 2012-08-29 (×3): via INTRAVENOUS

## 2012-08-29 MED ORDER — PRENATAL MULTIVITAMIN CH
1.0000 | ORAL_TABLET | Freq: Every day | ORAL | Status: DC
  Administered 2012-08-30: 1 via ORAL
  Filled 2012-08-29: qty 1

## 2012-08-29 MED ORDER — MENTHOL 3 MG MT LOZG: 1.0000 | LOZENGE | OROMUCOSAL | Status: DC | PRN

## 2012-08-29 MED ORDER — EPHEDRINE 5 MG/ML INJ
INTRAVENOUS | Status: AC
  Filled 2012-08-29: qty 10

## 2012-08-29 MED ORDER — SENNOSIDES-DOCUSATE SODIUM 8.6-50 MG PO TABS
2.0000 | ORAL_TABLET | Freq: Every day | ORAL | Status: DC
  Administered 2012-08-29 – 2012-08-30 (×2): 2 via ORAL

## 2012-08-29 MED ORDER — ONDANSETRON HCL 4 MG/2ML IJ SOLN: 4.0000 mg | Freq: Four times a day (QID) | INTRAMUSCULAR | Status: DC | PRN

## 2012-08-29 MED ORDER — DIBUCAINE 1 % RE OINT
1.0000 | TOPICAL_OINTMENT | RECTAL | Status: DC | PRN
Start: 2012-08-29 — End: 2012-08-31

## 2012-08-29 MED ORDER — MORPHINE SULFATE 0.5 MG/ML IJ SOLN
INTRAMUSCULAR | Status: AC
  Filled 2012-08-29: qty 10

## 2012-08-29 MED ORDER — FLEET ENEMA 7-19 GM/118ML RE ENEM: 1.0000 | ENEMA | Freq: Every day | RECTAL | Status: DC | PRN

## 2012-08-29 MED ORDER — FLEET ENEMA 7-19 GM/118ML RE ENEM: 1.0000 | ENEMA | RECTAL | Status: DC | PRN

## 2012-08-29 MED ORDER — LIDOCAINE HCL (PF) 1 % IJ SOLN: 30.0000 mL | INTRAMUSCULAR | Status: DC | PRN

## 2012-08-29 MED ORDER — LANOLIN HYDROUS EX OINT: 1.0000 "application " | TOPICAL_OINTMENT | CUTANEOUS | Status: DC | PRN

## 2012-08-29 MED ORDER — OXYCODONE-ACETAMINOPHEN 5-325 MG PO TABS
1.0000 | ORAL_TABLET | ORAL | Status: DC | PRN
  Administered 2012-08-31: 1 via ORAL
  Filled 2012-08-29: qty 1

## 2012-08-29 MED ORDER — CEFAZOLIN SODIUM-DEXTROSE 2-3 GM-% IV SOLR
2.0000 g | INTRAVENOUS | Status: DC
  Filled 2012-08-29: qty 50

## 2012-08-29 MED ORDER — SCOPOLAMINE 1 MG/3DAYS TD PT72
MEDICATED_PATCH | TRANSDERMAL | Status: AC
  Filled 2012-08-29: qty 1

## 2012-08-29 MED ORDER — NALOXONE HCL 0.4 MG/ML IJ SOLN: 0.4000 mg | INTRAMUSCULAR | Status: DC | PRN

## 2012-08-29 MED ORDER — WITCH HAZEL-GLYCERIN EX PADS: 1.0000 "application " | MEDICATED_PAD | CUTANEOUS | Status: DC | PRN

## 2012-08-29 MED ORDER — FENTANYL CITRATE 0.05 MG/ML IJ SOLN
INTRAMUSCULAR | Status: AC
  Filled 2012-08-29: qty 2

## 2012-08-29 MED ORDER — TETANUS-DIPHTH-ACELL PERTUSSIS 5-2.5-18.5 LF-MCG/0.5 IM SUSP: 0.5000 mL | Freq: Once | INTRAMUSCULAR | Status: DC

## 2012-08-29 MED ORDER — DIPHENHYDRAMINE HCL 50 MG/ML IJ SOLN: 12.5000 mg | INTRAMUSCULAR | Status: DC | PRN

## 2012-08-29 MED ORDER — CITRIC ACID-SODIUM CITRATE 334-500 MG/5ML PO SOLN
30.0000 mL | ORAL | Status: DC | PRN
  Administered 2012-08-29: 30 mL via ORAL
  Filled 2012-08-29: qty 15

## 2012-08-29 MED ORDER — PHENYLEPHRINE 40 MCG/ML (10ML) SYRINGE FOR IV PUSH (FOR BLOOD PRESSURE SUPPORT)
PREFILLED_SYRINGE | INTRAVENOUS | Status: AC
  Filled 2012-08-29: qty 10

## 2012-08-29 MED ORDER — NALOXONE HCL 1 MG/ML IJ SOLN
1.0000 ug/kg/h | INTRAVENOUS | Status: DC | PRN
  Filled 2012-08-29: qty 2

## 2012-08-29 MED ORDER — MORPHINE SULFATE (PF) 0.5 MG/ML IJ SOLN: INTRAMUSCULAR | Status: DC | PRN

## 2012-08-29 MED ORDER — DIPHENHYDRAMINE HCL 25 MG PO CAPS: 25.0000 mg | ORAL_CAPSULE | Freq: Four times a day (QID) | ORAL | Status: DC | PRN

## 2012-08-29 MED ORDER — KETOROLAC TROMETHAMINE 30 MG/ML IJ SOLN
INTRAMUSCULAR | Status: AC
  Filled 2012-08-29: qty 1

## 2012-08-29 MED ORDER — METOCLOPRAMIDE HCL 5 MG/ML IJ SOLN
10.0000 mg | Freq: Three times a day (TID) | INTRAMUSCULAR | Status: DC | PRN
  Administered 2012-08-29: 10 mg via INTRAVENOUS
  Filled 2012-08-29: qty 2

## 2012-08-29 MED ORDER — ONDANSETRON HCL 4 MG/2ML IJ SOLN
INTRAMUSCULAR | Status: DC | PRN
  Administered 2012-08-29: 4 mg via INTRAVENOUS

## 2012-08-29 MED ORDER — ACETAMINOPHEN 325 MG PO TABS: 650.0000 mg | ORAL_TABLET | ORAL | Status: DC | PRN

## 2012-08-29 MED ORDER — KETOROLAC TROMETHAMINE 30 MG/ML IJ SOLN
30.0000 mg | Freq: Four times a day (QID) | INTRAMUSCULAR | Status: AC | PRN
  Administered 2012-08-29 (×2): 30 mg via INTRAVENOUS
  Filled 2012-08-29: qty 1

## 2012-08-29 MED ORDER — TERBUTALINE SULFATE 1 MG/ML IJ SOLN
0.2500 mg | Freq: Once | INTRAMUSCULAR | Status: AC
  Administered 2012-08-29: 0.25 mg via SUBCUTANEOUS

## 2012-08-29 MED ORDER — DIPHENHYDRAMINE HCL 25 MG PO CAPS: 25.0000 mg | ORAL_CAPSULE | ORAL | Status: DC | PRN

## 2012-08-29 MED ORDER — OXYTOCIN 10 UNIT/ML IJ SOLN
40.0000 [IU] | INTRAVENOUS | Status: DC | PRN
  Administered 2012-08-29 (×2): 40 [IU] via INTRAVENOUS

## 2012-08-29 MED ORDER — MEPERIDINE HCL 25 MG/ML IJ SOLN
INTRAMUSCULAR | Status: AC
  Filled 2012-08-29: qty 1

## 2012-08-29 MED ORDER — PROMETHAZINE HCL 25 MG/ML IJ SOLN: 6.2500 mg | INTRAMUSCULAR | Status: DC | PRN

## 2012-08-29 MED ORDER — KETOROLAC TROMETHAMINE 30 MG/ML IJ SOLN: 30.0000 mg | Freq: Four times a day (QID) | INTRAMUSCULAR | Status: AC | PRN

## 2012-08-29 MED ORDER — ONDANSETRON HCL 4 MG/2ML IJ SOLN
INTRAMUSCULAR | Status: AC
  Filled 2012-08-29: qty 2

## 2012-08-29 MED ORDER — ZOLPIDEM TARTRATE 5 MG PO TABS: 5.0000 mg | ORAL_TABLET | Freq: Every evening | ORAL | Status: DC | PRN

## 2012-08-29 MED ORDER — FENTANYL CITRATE 0.05 MG/ML IJ SOLN
INTRAMUSCULAR | Status: DC | PRN
  Administered 2012-08-29: 25 ug via INTRAVENOUS

## 2012-08-29 MED ORDER — LACTATED RINGERS IV SOLN
INTRAVENOUS | Status: DC
  Administered 2012-08-29 – 2012-08-30 (×2): via INTRAVENOUS

## 2012-08-29 MED ORDER — TERBUTALINE SULFATE 1 MG/ML IJ SOLN
INTRAMUSCULAR | Status: AC
  Filled 2012-08-29: qty 1

## 2012-08-29 MED ORDER — OXYCODONE-ACETAMINOPHEN 5-325 MG PO TABS: 1.0000 | ORAL_TABLET | ORAL | Status: DC | PRN

## 2012-08-29 MED ORDER — DIPHENHYDRAMINE HCL 50 MG/ML IJ SOLN: 25.0000 mg | INTRAMUSCULAR | Status: DC | PRN

## 2012-08-29 MED ORDER — LACTATED RINGERS IV SOLN
INTRAVENOUS | Status: DC | PRN
  Administered 2012-08-29: 04:00:00 via INTRAVENOUS

## 2012-08-29 MED ORDER — SODIUM CHLORIDE 0.9 % IJ SOLN: 3.0000 mL | INTRAMUSCULAR | Status: DC | PRN

## 2012-08-29 MED ORDER — LACTATED RINGERS IV SOLN: 500.0000 mL | INTRAVENOUS | Status: DC | PRN

## 2012-08-29 MED ORDER — CEFAZOLIN SODIUM-DEXTROSE 2-3 GM-% IV SOLR
INTRAVENOUS | Status: DC | PRN
  Administered 2012-08-29: 2 g via INTRAVENOUS

## 2012-08-29 MED ORDER — SIMETHICONE 80 MG PO CHEW
80.0000 mg | CHEWABLE_TABLET | ORAL | Status: DC | PRN
  Administered 2012-08-30 (×2): 80 mg via ORAL

## 2012-08-29 MED ORDER — LEVOTHYROXINE SODIUM 125 MCG PO TABS
125.0000 ug | ORAL_TABLET | Freq: Every day | ORAL | Status: DC
  Administered 2012-08-30: 125 ug via ORAL
  Filled 2012-08-29 (×4): qty 1

## 2012-08-29 MED ORDER — OXYTOCIN 40 UNITS IN LACTATED RINGERS INFUSION - SIMPLE MED: 62.5000 mL/h | INTRAVENOUS | Status: AC

## 2012-08-29 MED ORDER — HYDROMORPHONE HCL PF 1 MG/ML IJ SOLN: 0.2500 mg | INTRAMUSCULAR | Status: DC | PRN

## 2012-08-29 MED ORDER — SIMETHICONE 80 MG PO CHEW
80.0000 mg | CHEWABLE_TABLET | Freq: Three times a day (TID) | ORAL | Status: DC
  Administered 2012-08-29 – 2012-08-31 (×3): 80 mg via ORAL

## 2012-08-29 MED ORDER — MEASLES, MUMPS & RUBELLA VAC ~~LOC~~ INJ
0.5000 mL | INJECTION | Freq: Once | SUBCUTANEOUS | Status: DC
  Filled 2012-08-29: qty 0.5

## 2012-08-29 MED ORDER — ONDANSETRON HCL 4 MG PO TABS: 4.0000 mg | ORAL_TABLET | ORAL | Status: DC | PRN

## 2012-08-29 MED ORDER — IBUPROFEN 600 MG PO TABS
600.0000 mg | ORAL_TABLET | Freq: Four times a day (QID) | ORAL | Status: DC
  Administered 2012-08-29 – 2012-08-31 (×8): 600 mg via ORAL
  Filled 2012-08-29 (×8): qty 1

## 2012-08-29 MED ORDER — BUPIVACAINE IN DEXTROSE 0.75-8.25 % IT SOLN
INTRATHECAL | Status: DC | PRN
  Administered 2012-08-29: 2 mL via INTRATHECAL

## 2012-08-29 MED ORDER — ONDANSETRON HCL 4 MG/2ML IJ SOLN: 4.0000 mg | INTRAMUSCULAR | Status: DC | PRN

## 2012-08-29 MED ORDER — PHENYLEPHRINE HCL 10 MG/ML IJ SOLN
INTRAMUSCULAR | Status: DC | PRN
  Administered 2012-08-29 (×3): 80 ug via INTRAVENOUS
  Administered 2012-08-29 (×2): 40 ug via INTRAVENOUS
  Administered 2012-08-29 (×2): 80 ug via INTRAVENOUS

## 2012-08-29 MED ORDER — OXYTOCIN 40 UNITS IN LACTATED RINGERS INFUSION - SIMPLE MED: 62.5000 mL/h | INTRAVENOUS | Status: DC

## 2012-08-29 MED ORDER — OXYTOCIN BOLUS FROM INFUSION
500.0000 mL | INTRAVENOUS | Status: DC
Start: 2012-08-29 — End: 2012-08-29

## 2012-08-29 MED ORDER — 0.9 % SODIUM CHLORIDE (POUR BTL) OPTIME
TOPICAL | Status: DC | PRN
  Administered 2012-08-29: 1000 mL

## 2012-08-29 MED ORDER — ACETAMINOPHEN 10 MG/ML IV SOLN: 1000.0000 mg | Freq: Once | INTRAVENOUS | Status: DC | PRN

## 2012-08-29 MED ORDER — ONDANSETRON HCL 4 MG/2ML IJ SOLN: 4.0000 mg | Freq: Three times a day (TID) | INTRAMUSCULAR | Status: DC | PRN

## 2012-08-29 MED ORDER — FERROUS SULFATE 325 (65 FE) MG PO TABS
325.0000 mg | ORAL_TABLET | Freq: Two times a day (BID) | ORAL | Status: DC
  Administered 2012-08-29 – 2012-08-31 (×3): 325 mg via ORAL
  Filled 2012-08-29 (×3): qty 1

## 2012-08-29 MED ORDER — IBUPROFEN 600 MG PO TABS: 600.0000 mg | ORAL_TABLET | Freq: Four times a day (QID) | ORAL | Status: DC | PRN

## 2012-08-29 SURGICAL SUPPLY — 36 items
APL SKNCLS STERI-STRIP NONHPOA (GAUZE/BANDAGES/DRESSINGS) ×1
BENZOIN TINCTURE PRP APPL 2/3 (GAUZE/BANDAGES/DRESSINGS) ×1 IMPLANT
CLOTH BEACON ORANGE TIMEOUT ST (SAFETY) ×2 IMPLANT
DRAPE LG THREE QUARTER DISP (DRAPES) ×2 IMPLANT
DRSG OPSITE POSTOP 4X10 (GAUZE/BANDAGES/DRESSINGS) ×2 IMPLANT
DURAPREP 26ML APPLICATOR (WOUND CARE) ×2 IMPLANT
ELECT REM PT RETURN 9FT ADLT (ELECTROSURGICAL) ×2
ELECTRODE REM PT RTRN 9FT ADLT (ELECTROSURGICAL) ×1 IMPLANT
EXTRACTOR VACUUM KIWI (MISCELLANEOUS) IMPLANT
GLOVE BIO SURGEON ST LM GN SZ9 (GLOVE) ×2 IMPLANT
GLOVE BIOGEL PI IND STRL 9 (GLOVE) ×1 IMPLANT
GLOVE BIOGEL PI INDICATOR 9 (GLOVE) ×1
GOWN PREVENTION PLUS XLARGE (GOWN DISPOSABLE) ×2 IMPLANT
GOWN STRL REIN 3XL LVL4 (GOWN DISPOSABLE) ×2 IMPLANT
GOWN STRL REIN XL XLG (GOWN DISPOSABLE) ×4 IMPLANT
NDL HYPO 25X5/8 SAFETYGLIDE (NEEDLE) IMPLANT
NEEDLE HYPO 25X5/8 SAFETYGLIDE (NEEDLE) IMPLANT
NS IRRIG 1000ML POUR BTL (IV SOLUTION) ×1 IMPLANT
PACK C SECTION WH (CUSTOM PROCEDURE TRAY) ×2 IMPLANT
PAD OB MATERNITY 4.3X12.25 (PERSONAL CARE ITEMS) ×2 IMPLANT
RETRACTOR WND ALEXIS 25 LRG (MISCELLANEOUS) IMPLANT
RTRCTR C-SECT PINK 25CM LRG (MISCELLANEOUS) ×1 IMPLANT
RTRCTR WOUND ALEXIS 25CM LRG (MISCELLANEOUS)
SLEEVE SCD COMPRESS KNEE MED (MISCELLANEOUS) IMPLANT
SPONGE LAP 18X18 X RAY DECT (DISPOSABLE) ×1 IMPLANT
STRIP CLOSURE SKIN 1/2X4 (GAUZE/BANDAGES/DRESSINGS) ×1 IMPLANT
SUT CHROMIC 0 CTX 36 (SUTURE) ×4 IMPLANT
SUT VIC AB 0 CT1 27 (SUTURE) ×2
SUT VIC AB 0 CT1 27XBRD ANBCTR (SUTURE) ×1 IMPLANT
SUT VIC AB 2-0 CT1 27 (SUTURE) ×4
SUT VIC AB 2-0 CT1 TAPERPNT 27 (SUTURE) ×2 IMPLANT
SUT VIC AB 4-0 KS 27 (SUTURE) ×2 IMPLANT
SYR BULB IRRIGATION 50ML (SYRINGE) IMPLANT
TOWEL OR 17X24 6PK STRL BLUE (TOWEL DISPOSABLE) ×2 IMPLANT
TRAY FOLEY CATH 14FR (SET/KITS/TRAYS/PACK) ×2 IMPLANT
WATER STERILE IRR 1000ML POUR (IV SOLUTION) ×2 IMPLANT

## 2012-08-29 NOTE — Lactation Note (Signed)
This note was copied from the chart of Boy [facility]. Lactation Consultation Note Mom states baby has been very sleepy and not feeding vigorously yet. Baby is now 61 hours old. Reassured mom that babies this young do sleep a lot. Enc mom to continue frequent STS and cue based feeding. Baby in bassinet, starting to wiggle. Offered to assist mom with latch; mom accepts. Mom attempted to latch baby in cradle hold, demonstrated cross cradle to mom. Baby latched but did not suck, and went back to sleep. Enc mom to continue attempting to br feed and to limit formula unless necessary.  Mom denies questions at this time. Lactation brochure in [place] and Spanish provided for mom. Enc mom to call for help if needed. Enc mom to call lactation office if she has any concerns after d/c, and to attend the BFSG.  Patient Name: Boy Carylon Tamburro NFAOZ'H Date: 08/29/2012 Reason for consult: Initial assessment   Maternal Data Formula Feeding for Exclusion: Yes Reason for exclusion: Mother's choice to formula and breast feed on admission Has patient been taught Hand Expression?: Yes Does the patient have breastfeeding experience prior to this delivery?: Yes  Feeding Feeding Type: Breast Fed Feeding method: Breast Nipple Type: Slow - flow Length of feed: 5 min  LATCH Score/Interventions Latch: Too sleepy or reluctant, no latch achieved, no sucking elicited. Intervention(s): Skin to skin;Teach feeding cues;Waking techniques                    Lactation Tools Discussed/Used     Consult Status Consult Status: Follow-up Follow-up type: In-patient    Octavio Manns [facility] 08/29/2012, 2:53 PM

## 2012-08-29 NOTE — Progress Notes (Signed)
Patient ID: Sandra [name], female   DOB: 11-Sep-1980, 32 y.o.   MRN: [id number]  Pt admitted for SROM with contractions. 2.5 cm dilation, presenting part not head per resident. Bedside sono shows fetus in breech presentation with head up and to mom's left. Explained need for cesarean section to patient and husband.  The risks of cesarean section discussed with the patient included but were not limited to: bleeding which may require transfusion or reoperation; infection which may require antibiotics; injury to bowel, bladder, ureters or other surrounding organs; injury to the fetus; need for additional procedures including hysterectomy in the event of a life-threatening hemorrhage; placental abnormalities wth subsequent pregnancies, incisional problems, thromboembolic phenomenon and other postoperative/anesthesia complications. The patient concurred with the proposed plan, giving informed written consent for the procedure.   Patient has been NPO since 9:00 pm (6 hours) she will remain NPO for procedure. Anesthesia and OR aware. Preoperative prophylactic antibiotics and SCDs ordered on call to the OR.  To OR when ready.  Napoleon Form, MD

## 2012-08-29 NOTE — Progress Notes (Signed)
Patient evaluated by ultrasound at bedside by me, confirming breech presentation, vertex in LUQ with spine to maternal right.  Anterior fundal placenta,  Consent obtained thru translator, with risks reviewed to patient acceptance.   Terbutalene 250 mcg sq administered. Will proceed with cesarean section.  Future contraception :  Pills. OR preparations in place.

## 2012-08-29 NOTE — Anesthesia Procedure Notes (Signed)
Spinal  Start time: 08/29/2012 3:33 AM End time: 08/29/2012 3:40 AM Staffing Anesthesiologist: Lewie Loron R Performed by: anesthesiologist  Preanesthetic Checklist Completed: patient identified, surgical consent, pre-op evaluation, timeout performed, IV checked, risks and benefits discussed and monitors and equipment checked Spinal Block Patient position: sitting Prep: DuraPrep Patient monitoring: heart rate, continuous pulse ox and blood pressure Approach: midline Location: L3-4 Injection technique: single-shot Needle Needle type: Quincke  Needle gauge: 24 G Needle length: 9 cm Assessment Sensory level: T6

## 2012-08-29 NOTE — MAU Note (Signed)
Water broke around 1am. Clear fluid. Some contractions

## 2012-08-29 NOTE — Op Note (Signed)
Sandra [name]   PROCEDURE DATE: 08/29/2012   PREOPERATIVE DIAGNOSIS: Intrauterine pregnancy at [redacted]w[redacted]d gestation; malpresentation: breech, SROM and in labor, and previous cesarean section x 1  POSTOPERATIVE DIAGNOSIS: The same  PROCEDURE: Repeat Low Transverse Cesarean Section  SURGEON:  Christin Bach, MD  ASSISTANT:  Napoleon Form, MD  INDICATIONS: Sandra [name] is a 32 y.o. [phone] at [redacted]w[redacted]d presenting with spontaneous rupture of membranes in early labor and found to have breech presentation. She had one previous cesarean section and a prior successful VBAC and had planned for TOLAC  However, given breech presentation, she was taken for cesarean section. The risks of cesarean section were discussed with the patient including but were not limited to: bleeding which may require transfusion or reoperation; infection which may require antibiotics; injury to bowel, bladder, ureters or other surrounding organs; injury to the fetus; need for additional procedures including hysterectomy in the event of a life-threatening hemorrhage; placental abnormalities wth subsequent pregnancies, incisional problems, thromboembolic phenomenon and other postoperative/anesthesia complications.   The patient concurred with the proposed plan, giving informed written consent for the procedure.    FINDINGS:  Viable female infant in cephalic presentation.  Apgars 9 and 9.  Clear amniotic fluid.  Intact placenta, three vessel cord.  Normal uterus, fallopian tubes and ovaries bilaterally.  ANESTHESIA: Spinal INTRAVENOUS FLUIDS:  2500 ml ESTIMATED BLOOD LOSS: 600 ml URINE OUTPUT:  550 ml SPECIMENS: Placenta sent to L&D COMPLICATIONS: None immediate  PROCEDURE IN DETAIL:  The patient preoperatively received intravenous antibiotics and had sequential compression devices applied to her lower extremities.  She was then taken to the operating room where spinal anesthesia was administered and was found to be adequate.  She was then placed in a dorsal supine position with a leftward tilt, and prepped and draped in a sterile manner.  A foley catheter was placed into her bladder and attached to constant gravity.  After an adequate timeout was performed, a Pfannenstiel skin incision was made with scalpel and carried through to the underlying layer of fascia. The fascia was incised in the midline, and this incision was extended bilaterally using the Mayo scissors.  Kocher clamps were applied to the superior aspect of the fascial incision and the underlying rectus muscles were dissected off bluntly and sharply. A similar process was carried out on the inferior aspect of the fascial incision. The rectus muscles were separated in the midline bluntly. Significant adhesions were encountered between the peritoneum, omentum and anterior surface of the uterus. Careful dissection of adhesions was performed with electrocautery and Mayo scissors until adequate access was obtained for delivery. Alexis O self-retaining retractor was placed. Attention was then turned to the lower uterine segment where a low transverse hysterotomy was made with a scalpel and extended bilaterally bluntly. The infant was successfully delivered from breech presentation. The cord was clamped and cut and the infant was handed over to awaiting neonatology team. Uterine massage was then administered, and the placenta delivered intact with a three-vessel cord. The uterus was then exteriorized and cleared of clot and debris. The hysterotomy was closed with 0 Chromic in a running locked fashion, and an imbricating layer was also placed with the same suture. The uterus was returned to the pelvis, and the pelvis was irrigated and cleared of all clot and debris. Hemostasis was confirmed on all surfaces.  The peritoneum and the muscles were reapproximated using 0 Vicryl interrupted stitches. The fascia was then closed using 0 Vicryl in a running fashion.  The subcutaneous layer  was irrigated, then reapproximated with 2-0 plain gut interrupted stitches, and the skin was closed with a 4-0 Vicryl subcuticular stitch. The patient tolerated the procedure well. Sponge, lap, instrument and needle counts were correct x 2.  The foley catheter drained clear urine throughout the procedure. She was taken to the recovery room in stable condition.   Napoleon Form, MD 08/29/2012 5:32 AM

## 2012-08-29 NOTE — Anesthesia Postprocedure Evaluation (Signed)
Anesthesia Post Note  Patient: Sandra [name]  Procedure(s) Performed: Procedure(s) (LRB): CESAREAN SECTION (N/A)  Anesthesia type: Spinal  Patient location: PACU  Post pain: Pain level controlled  Post assessment: Post-op Vital signs reviewed  Last Vitals: BP 119/70  Pulse 70  Temp(Src) 36.6 C (Oral)  Resp 20  Ht 5' 0.75" (1.543 m)  Wt 187 lb (84.823 kg)  BMI 35.63 kg/m2  SpO2 96%  LMP 12/04/2011  Post vital signs: Reviewed  Level of consciousness: sedated  Complications: No apparent anesthesia complications

## 2012-08-29 NOTE — Transfer of Care (Signed)
Immediate Anesthesia Transfer of Care Note  Patient: Sandra [name]  Procedure(s) Performed: Procedure(s) with comments: CESAREAN SECTION (N/A) - Repeat Cesarean Section Delivery Baby Boy @ [phone], Apgars 9/9  Patient Location: PACU  Anesthesia Type:Spinal  Level of Consciousness: awake, alert  and oriented  Airway & Oxygen Therapy: Patient Spontanous Breathing  Post-op Assessment: Report given to PACU RN and Post -op Vital signs reviewed and stable  Post vital signs: Reviewed and stable  Complications: No apparent anesthesia complications

## 2012-08-29 NOTE — Anesthesia Preprocedure Evaluation (Addendum)
Anesthesia Evaluation  Patient identified by MRN, date of birth, ID band Patient awake    Reviewed: Allergy & Precautions, H&P , NPO status , Patient's Chart, lab work & pertinent test results  Airway Mallampati: III TM Distance: >3 FB Neck ROM: Full    Dental  (+) Dental Advisory Given and Teeth Intact   Pulmonary neg pulmonary ROS,  breath sounds clear to auscultation        Cardiovascular negative cardio ROS  Rhythm:Regular Rate:Normal     Neuro/Psych negative neurological ROS  negative psych ROS   GI/Hepatic negative GI ROS, Neg liver ROS,   Endo/Other  Hypothyroidism   Renal/GU negative Renal ROS     Musculoskeletal negative musculoskeletal ROS (+)   Abdominal   Peds  Hematology negative hematology ROS (+)   Anesthesia Other Findings   Reproductive/Obstetrics (+) Pregnancy                          Anesthesia Physical Anesthesia Plan  ASA: II and emergent  Anesthesia Plan: Spinal   Post-op Pain Management:    Induction:   Airway Management Planned:   Additional Equipment:   Intra-op Plan:   Post-operative Plan:   Informed Consent: I have reviewed the patients History and Physical, chart, labs and discussed the procedure including the risks, benefits and alternatives for the proposed anesthesia with the patient or authorized representative who has indicated his/her understanding and acceptance.   Dental advisory given  Plan Discussed with: CRNA  Anesthesia Plan Comments:         Anesthesia Quick Evaluation

## 2012-08-29 NOTE — Anesthesia Postprocedure Evaluation (Signed)
  Anesthesia Post-op Note  Patient: Sandra [name]  Procedure(s) Performed: Procedure(s) with comments: CESAREAN SECTION (N/A) - Repeat Cesarean Section Delivery Baby Boy @ [phone], Apgars 9/9  Patient Location: Mother/Baby  Anesthesia Type:Epidural  Level of Consciousness: awake, alert  and oriented  Airway and Oxygen Therapy: Patient Spontanous Breathing  Post-op Pain: mild  Post-op Assessment: Post-op Vital signs reviewed, Patient's Cardiovascular Status Stable, No headache, No backache, No residual numbness and No residual motor weakness  Post-op Vital Signs: Reviewed and stable  Complications: No apparent anesthesia complications

## 2012-08-29 NOTE — H&P (Signed)
Sandra [name] is a 32 y.o. female presenting for loss of fluid about 2 hours ago. She says that at that time she had a gush of clear fluid. She began to have contractions starting out at a 1/10 that are now a 6/10 in severity. She denies decreased fetal movement and vaginal bleeding. She has had a small amount of bloody mucus pass tonight. She denies Chest pain, headache, and dyspnea. She does note some mild swelling in her hands and feet for the last two weeks. She also notes some itching primarily on her dorsal feet and abdomen which improved some with lotion. She gets her care at [facility] family medicine with Dr. Louanne Belton and has had an uneventful pregnancy.   She received prenatal care at [facility]. Her pregnancy course has been uneventful with normal one hour GTT. Fetal anatomy scan showed normal anatomy except for an echogenic focus in the left ventricle. Her OB history is significant for a primary c-section with her first pregnancy for arrest of descent, followed by a successful VBAC. She is planning TOLAC for this pregnancy and signed consent is scanned in EPIC.   History OB History   Grav Para Term Preterm Abortions TAB SAB Ect Mult Living   4 2 2  1 1    2      Past Medical History  Diagnosis Date  . Hypothyroid    Past Surgical History  Procedure Laterality Date  . Cesarean section    . Appendectomy     Family History: family history includes Diabetes in her mother; Hypertension in her father and mother; and Hypothyroidism in her sister. Social History:  reports that she has never smoked. She does not have any smokeless tobacco history on file. She reports that she does not drink alcohol or use illicit drugs.   Prenatal Transfer Tool  Maternal Diabetes: No Genetic Screening: Declined Maternal Ultrasounds/Referrals: Abnormal:  Findings:   Isolated EIF (echogenic intracardiac focus) Fetal Ultrasounds or other Referrals:  None Maternal Substance Abuse:   No Significant Maternal Medications:  Meds include: Syntroid Significant Maternal Lab Results:  Lab values include: Group B Strep negative Other Comments:  None  ROS Per HPI  Dilation: 2.5 Effacement (%): 80 Exam by:: Dr. Ermalinda Memos Blood pressure 141/82, pulse 89, temperature 98.2 F (36.8 C), temperature source Oral, resp. rate 20, height 5' 0.75" (1.543 m), weight 84.823 kg (187 lb), last menstrual period 12/04/2011. Exam Physical Exam  Gen: NAD, alert, cooperative with exam HEENT: NCAT, moist mucous membranes Resp:non-labored Abd: Soft, pregnant abdomen Ext: trace pitting edema on BL LE Neuro: Alert and oriented, No gross deficits  FHT: Baseline 135, Moderate variability, accels present, no decels Toco: Irreg q 2-5 minutes  Prenatal labs: ABO, Rh: A/POS/-- (09/03 1348) Antibody: NEG (09/03 1348) Rubella: 266.3 (09/03 1348) RPR: NON REAC (12/11 1042)  HBsAg: NEGATIVE (09/03 1348)  HIV: NON REACTIVE (12/11 1042)  GBS: NEGATIVE (02/24 1529)   Assessment/Plan: 32 y/o [id number] here with SROM  - Note previous C/S and VBAC, she had originally planned another VBAC - With breech presentation will do C/S, patient has consented - GBS negative - Category 1 fetal strip - Anticipate caesarian delivery  Kevin Fenton 08/29/2012, 2:57 AM  I saw and examined patient and reviewed prenatal record, imaging, labs and FHTs (category I). I personally performed bedside ultrasound to determine fetal presentation (breech). OR and anesthesia notified and pre-op orders placed. Interview and consent done through Engineer, structural.  Napoleon Form, MD

## 2012-08-29 NOTE — Progress Notes (Signed)
UR completed 

## 2012-08-30 LAB — CBC
MCHC: 34.6 g/dL (ref 30.0–36.0)
Platelets: 185 10*3/uL (ref 150–400)
RDW: 14.5 % (ref 11.5–15.5)
WBC: 12.7 10*3/uL — ABNORMAL HIGH (ref 4.0–10.5)

## 2012-08-30 NOTE — Progress Notes (Signed)
Subjective: Postpartum Day 1: Cesarean Delivery Patient reports tolerating PO, + flatus and no problems voiding.  Reports pain well controlled with medication.  Objective: Vital signs in last 24 hours: Temp:  [97.7 F (36.5 C)-98.2 F (36.8 C)] 97.7 F (36.5 C) (03/07 0437) Pulse Rate:  [62-83] 74 (03/07 0437) Resp:  [16-20] 18 (03/07 0437) BP: (106-135)/(57-74) 112/69 mmHg (03/07 0437) SpO2:  [96 %-99 %] 97 % (03/07 0437)  Physical Exam:  General: alert, cooperative and appears stated age CVS:  RRR, without murmur, gallops, or rubs Lungs:  CTA bilat ABD:  +BSx4, normal Lochia: appropriate Uterine Fundus: firm Incision: no significant drainage, dressing clean, dry, and intact DVT Evaluation: No evidence of DVT seen on physical exam. Negative Homan's sign.   Recent Labs  08/29/12 0235 08/30/12 0545  HGB 11.8* 8.8*  HCT 32.9* 25.4*    Assessment/Plan: Status post Cesarean section. Doing well postoperatively.  Continue current care.  [facility] 08/30/2012, 9:38 AM

## 2012-08-31 ENCOUNTER — Encounter (HOSPITAL_COMMUNITY): Payer: Self-pay | Admitting: Obstetrics and Gynecology

## 2012-08-31 MED ORDER — IBUPROFEN 600 MG PO TABS: 600.0000 mg | ORAL_TABLET | Freq: Four times a day (QID) | ORAL | Status: DC

## 2012-08-31 MED ORDER — OXYCODONE-ACETAMINOPHEN 5-325 MG PO TABS: 1.0000 | ORAL_TABLET | ORAL | Status: DC | PRN

## 2012-08-31 NOTE — Discharge Summary (Signed)
Physician Discharge Summary  Patient ID: Sandra [name] MRN: [id number] DOB/AGE: 33-Aug-1982 32 y.o.  Admit date: 08/29/2012 Discharge date: 08/31/2012  Admission Diagnoses: Previous Caesarean section for repeat Discharge Diagnoses:  Same as above  Discharged Condition: good  Hospital Course: unremarkable  Consults: None  Significant Diagnostic Studies: labs: routine  Treatments: surgery: repeat Caesarean section  Discharge Exam: Blood pressure 111/77, pulse 70, temperature 97.5 F (36.4 C), temperature source Oral, resp. rate 18, height 5' 0.75" (1.543 m), weight 84.823 kg (187 lb), last menstrual period 12/04/2011, SpO2 97.00%, unknown if currently breastfeeding. General appearance: alert, cooperative and no distress GI: soft, non-tender; bowel sounds normal; no masses,  no organomegaly Incision/Wound:clean dry intact  Disposition:   Discharge Orders   Future Appointments Provider Department Dept Phone   09/05/2012 2:45 PM Brent Bulla, MD [place] [facility] [phone]   Future Orders Complete By Expires     Call MD for:  persistant nausea and vomiting  As directed     Call MD for:  severe uncontrolled pain  As directed     Call MD for:  temperature >100.4  As directed     Diet - low sodium heart healthy  As directed     Discharge wound care:  As directed     Comments:      Keep clean and dry    Driving Restrictions  As directed     Comments:      None    Increase activity slowly  As directed     Lifting restrictions  As directed     Comments:      Just lift the Babay    Sexual Activity Restrictions  As directed     Comments:      None for 6-8 weeks        Medication List    TAKE these medications       ibuprofen 600 MG tablet  Commonly known as:  ADVIL,MOTRIN  Take 1 tablet (600 mg total) by mouth every 6 (six) hours.     levothyroxine 150 MCG tablet  Commonly known as:  SYNTHROID, LEVOTHROID  Take 150 mcg by mouth daily.     oxyCODONE-acetaminophen 5-325 MG per tablet  Commonly known as:  PERCOCET/ROXICET  Take 1-2 tablets by mouth every 4 (four) hours as needed.     prenatal multivitamin Tabs  Take 1 tablet by mouth at bedtime.           Follow-up Information   Follow up with Tilda Burrow, MD On 09/06/2012. (already scheduled)    Contact information:   [address] [place] [address] [phone]       Signed: Lazaro Arms 08/31/2012, 7:39 AM

## 2012-09-01 NOTE — Op Note (Signed)
Attestation of Attending Supervision of Advanced Practitioner: Evaluation and management procedures were performed by the PA/NP/CNM/OB Fellow under my supervision/collaboration,  And I was involved in entire case.. Chart reviewed and agree with management and plan and documentation.  Tilda Burrow 09/01/2012 8:43 PM

## 2012-09-01 NOTE — H&P (Signed)
I have seen @PT @ and examination done.  I agree with documentation and plan as noted in resident/PA's note. We proceeded to cesarean delivery FERGUSON,JOHN V 09/01/2012 8:41 PM

## 2012-09-05 ENCOUNTER — Ambulatory Visit (INDEPENDENT_AMBULATORY_CARE_PROVIDER_SITE_OTHER): Payer: Medicaid Other | Admitting: Family Medicine

## 2012-09-05 NOTE — Patient Instructions (Signed)
It was good to see you today!

## 2012-09-05 NOTE — Progress Notes (Signed)
Subjective:     Sandra [name] is a 32 y.o. female who presents for a postpartum visit. She is 1 weeks postpartum following a low cervical transverse Cesarean section. I have fully reviewed the prenatal and intrapartum course. The delivery was at 37 gestational weeks. Outcome: primary cesarean section, low transverse incision and cesarean indication: malpresentation: breech. Anesthesia: epidural. Postpartum course has been uncomplicated. Baby's course has been uncomplicated. Baby is feeding by both breast and bottle -  . Bleeding thin lochia. Bowel function is normal. Bladder function is normal. Patient is not sexually active. Contraception method is abstinence. Postpartum depression screening: negative.  Objective:    BP 108/72  Temp(Src) 97.9 F (36.6 C)  Wt 172 lb (78.019 kg)  BMI 32.77 kg/m2  LMP 12/04/2011  General:  alert, cooperative and appears stated age  Abdomen: soft, non-tender; bowel sounds normal; no masses,  no organomegaly and incision with steri-strips, stitched.  No erythema/tenderness        Assessment:     Normal postpartum exam.   Plan:    1. Contraception: abstinence 2. Follow up in: 5 weeks, will do cervical exam at that time and check TSH.

## 2012-09-11 ENCOUNTER — Telehealth (HOSPITAL_COMMUNITY): Payer: Self-pay | Admitting: *Deleted

## 2012-09-11 NOTE — Telephone Encounter (Signed)
Resolve episode 

## 2012-10-09 ENCOUNTER — Encounter: Payer: Self-pay | Admitting: Family Medicine

## 2012-10-09 ENCOUNTER — Ambulatory Visit (INDEPENDENT_AMBULATORY_CARE_PROVIDER_SITE_OTHER): Payer: Self-pay | Admitting: Family Medicine

## 2012-10-09 DIAGNOSIS — E018 Other iodine-deficiency related thyroid disorders and allied conditions: Secondary | ICD-10-CM

## 2012-10-09 LAB — TSH: TSH: 0.137 u[IU]/mL — ABNORMAL LOW (ref 0.350–4.500)

## 2012-10-09 MED ORDER — NYSTATIN 100000 UNIT/GM EX POWD: Freq: Four times a day (QID) | CUTANEOUS | Status: DC

## 2012-10-09 NOTE — Patient Instructions (Addendum)
We will check your thyroid today. Apply the powder I am giving you 3-4 times per day to your scar.

## 2012-10-09 NOTE — Progress Notes (Signed)
Patient ID: Adylene Dlugosz, female   DOB: 10/09/1980, 32 y.o.   MRN: [id number] Subjective: The patient is a 32 y.o. year old female who presents today for postpartum care.  C/S for CPD.  Since then doing well.  No bleeding, fevers/chills, or pain.  Adjusting well to baby.  Only complaint is some itching at site of scar.  Patient's past medical, social, and family history were reviewed and updated as appropriate. History  Substance Use Topics  . Smoking status: Never Smoker   . Smokeless tobacco: Not on file  . Alcohol Use: No   Objective:  Filed Vitals:   10/09/12 0924  BP: 106/67  Pulse: 59  Temp: 97.8 F (36.6 C)   Gen: NAD Abd: SNTND, well healed incision.  There is some redness on either side of incision at places corresponding to staples.  There is no induration, drainage, or pain with palpation. Pelvic exam: normal external genitalia, vulva, vagina, cervix, uterus and adnexa.  Assessment/Plan: Postpartum, doing well.  ?yeast infection at surgical site vs normal healing.  Will give rx for nystatin and rec neosporin several times per day.  Please also see individual problems in problem list for problem-specific plans.

## 2012-10-09 NOTE — Assessment & Plan Note (Signed)
Recheck today.  May need to adjust synthroid down now that no longer pregnant.

## 2012-10-14 ENCOUNTER — Telehealth: Payer: Self-pay | Admitting: Family Medicine

## 2012-10-14 MED ORDER — LEVOTHYROXINE SODIUM 125 MCG PO TABS: 125.0000 ug | ORAL_TABLET | Freq: Every day | ORAL | Status: DC

## 2012-10-14 NOTE — Telephone Encounter (Signed)
Please let patient know that we need to decrease the dose of her thyroid medicine slightly.  I have sent in new rx.  She should plan on having thryoid rechecked in 6-8 weeks.

## 2012-10-15 NOTE — Telephone Encounter (Signed)
Related message,pt voiced understanding. Annisha Baar S  

## 2012-11-26 ENCOUNTER — Ambulatory Visit (INDEPENDENT_AMBULATORY_CARE_PROVIDER_SITE_OTHER): Payer: No Typology Code available for payment source | Admitting: Family Medicine

## 2012-11-26 ENCOUNTER — Encounter: Payer: Self-pay | Admitting: Family Medicine

## 2012-11-26 VITALS — BP 117/77 | HR 56 | Temp 97.8°F | Ht 61.0 in | Wt 153.0 lb

## 2012-11-26 DIAGNOSIS — E039 Hypothyroidism, unspecified: Secondary | ICD-10-CM

## 2012-11-26 DIAGNOSIS — E018 Other iodine-deficiency related thyroid disorders and allied conditions: Secondary | ICD-10-CM

## 2012-11-26 LAB — T4, FREE: Free T4: 1.32 ng/dL (ref 0.80–1.80)

## 2012-11-26 LAB — TSH: TSH: 0.386 u[IU]/mL (ref 0.350–4.500)

## 2012-11-26 NOTE — Progress Notes (Signed)
  Subjective:    Patient ID: Sandra [name], female    DOB: 1980/12/05, 32 y.o.   MRN: [id number]  HPI Visit conducted in Spanish.  Sandra [name] comes in today for follow up of her hypothryoidism; had a low TSH at last check in April 16th, dose of LT4 was reduced to 152mcg/day (had taken MWF and 13mcg/day other days during her pregnancy).  She delivered by C/S on March 6th.  Has had some tingling around the transverse incision site but no pain, no abd pain, no bloating, no skin suppuration from wound.  Since then she has felt well; no resumption of menses.  Husband uses condoms.  She is breast feeding without problems. Son Sandra [name].   No palpitations, no constipation or loose stool.  She has one BM/day that is soft and formed.  No vaginal discharge.        Review of SystemsSee HPI     Objective:   Physical Exam Well appearing, no apparent distress HEENT Neck supple. Thyroid supple without nodules.  MMM COR Regular S1S2 ABD: Soft, nontender. Transverse incision clean dry and intact, without suppuration.        Assessment & Plan:

## 2012-11-26 NOTE — Assessment & Plan Note (Signed)
Patient with iatrogenic hypothyroidism following RAIA treatment; feels well. For recheck of TSH and free T4 following abnormally low TSH in mid-April.  She takes 171mcg/day LT4.   To call patient after lab results; if normalized, then to resume annual checks.   She has not had menses since delivery on March 6th.  Declines offer for pregnancy test today.

## 2012-11-27 ENCOUNTER — Telehealth: Payer: Self-pay | Admitting: Family Medicine

## 2012-11-27 NOTE — Telephone Encounter (Signed)
Called patient, in [place].  Reported her normal TSH level today.  For annual TSH checks, unless she develops symptoms that we think are related to hypo/hyperthyroidism (iatrogenic). JB

## 2013-05-28 IMAGING — US US OB DETAIL+14 WK
2 series · 12 of 28 positions shown · non-contrast
Comparison: none

[Series 1: us ob detail +14 wk · 9 of 82 slices shown (1 of 2)]
[im 4/82]
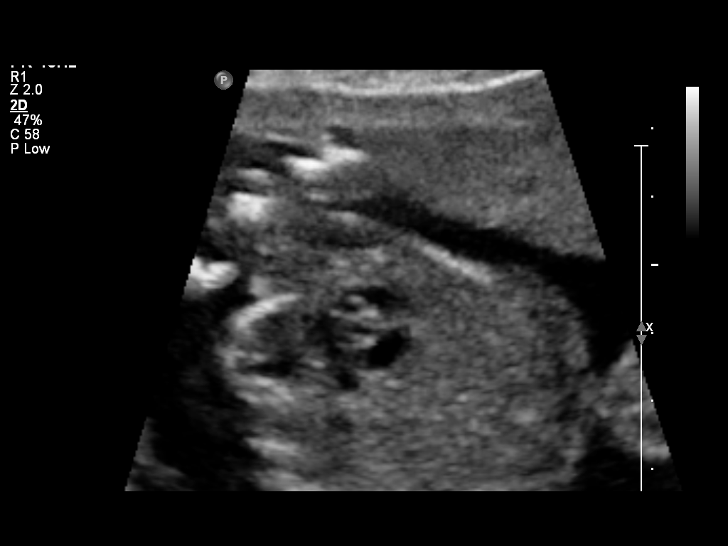
[im 12/82]
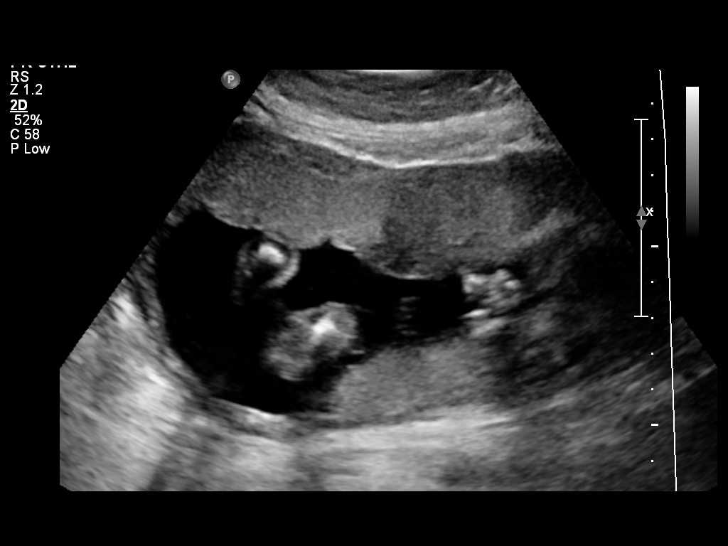
[im 20/82]
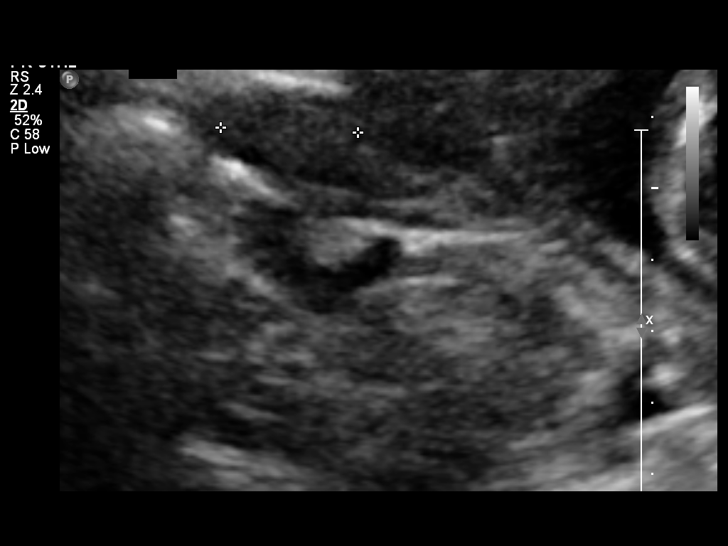
[im 31/82]
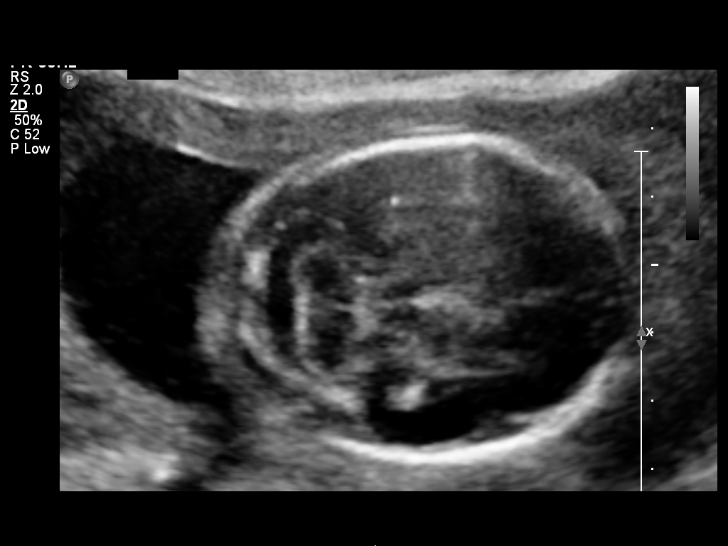
[im 39/82]
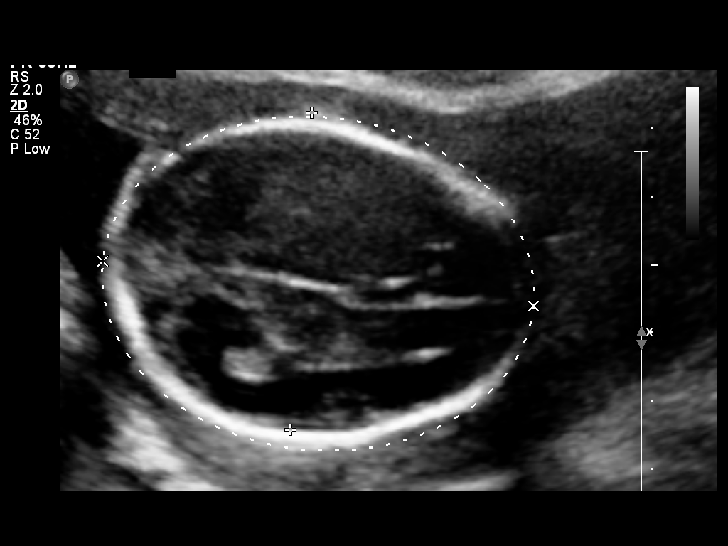
[im 47/82]
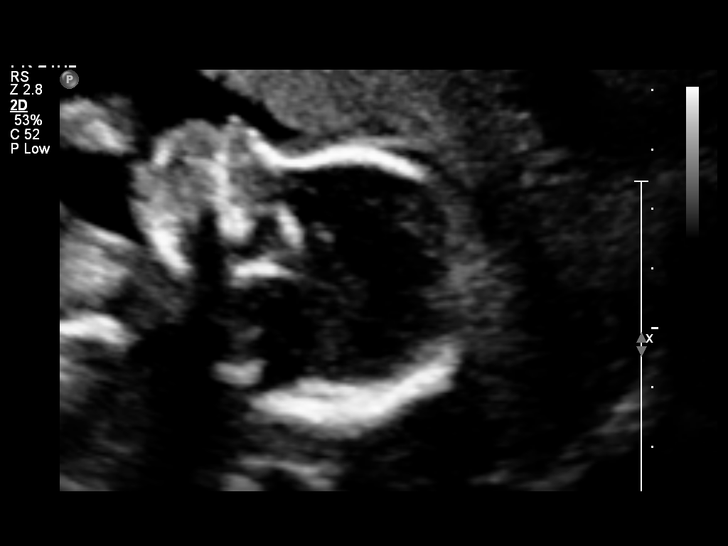
[im 58/82]
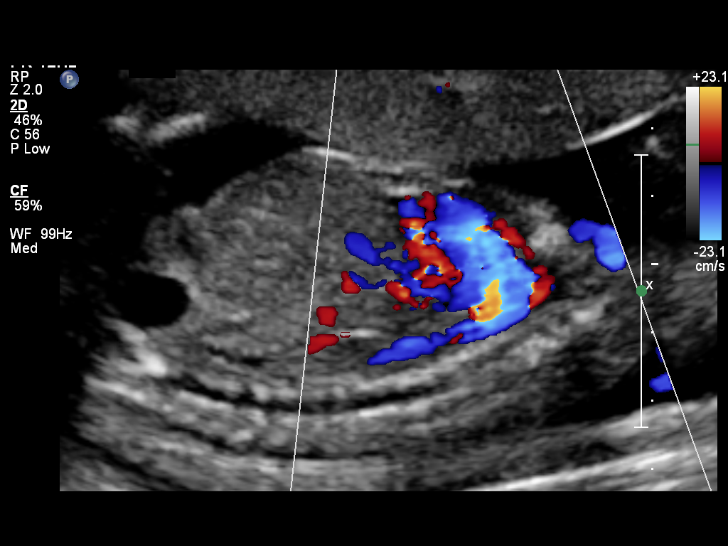
[im 66/82]
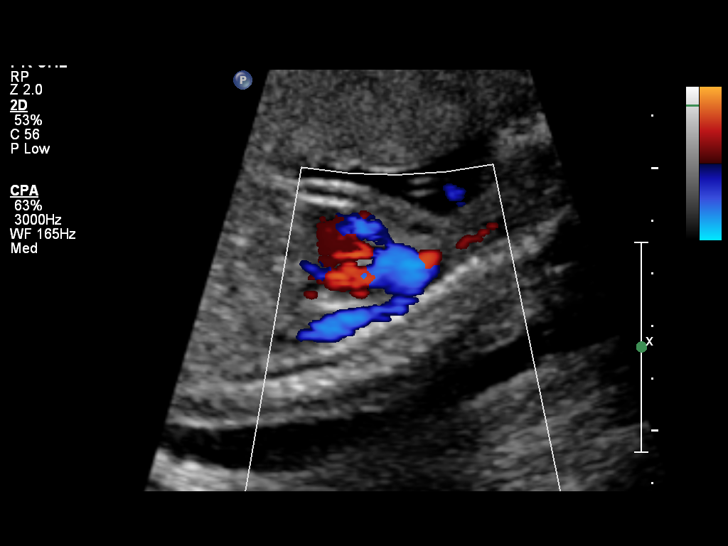
[im 74/82]
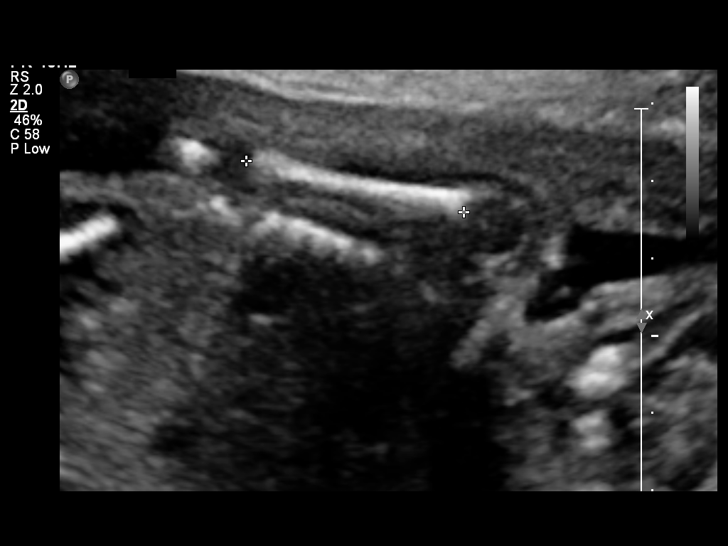

[Series 1: us ob detail +14 wk · 3 of 21 slices shown (2 of 2)]
[im 1/21]
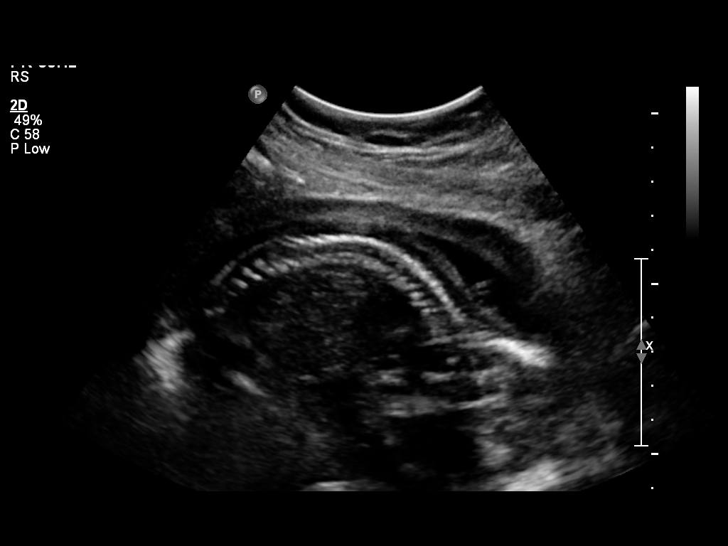
[im 9/21]
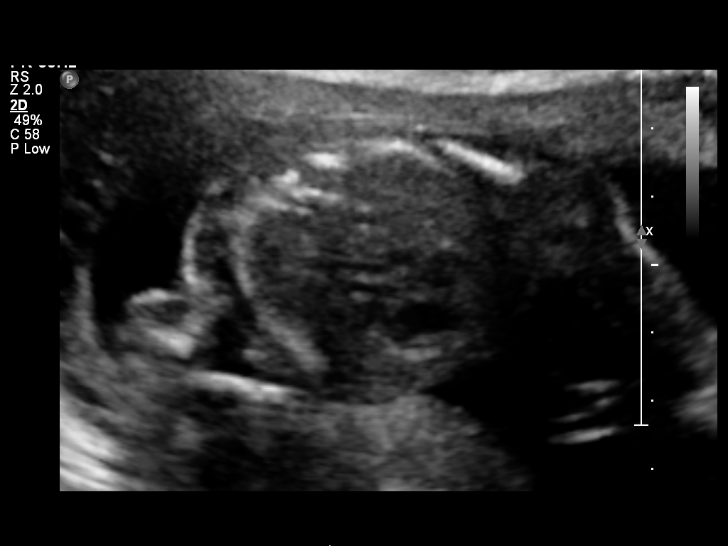
[im 17/21]
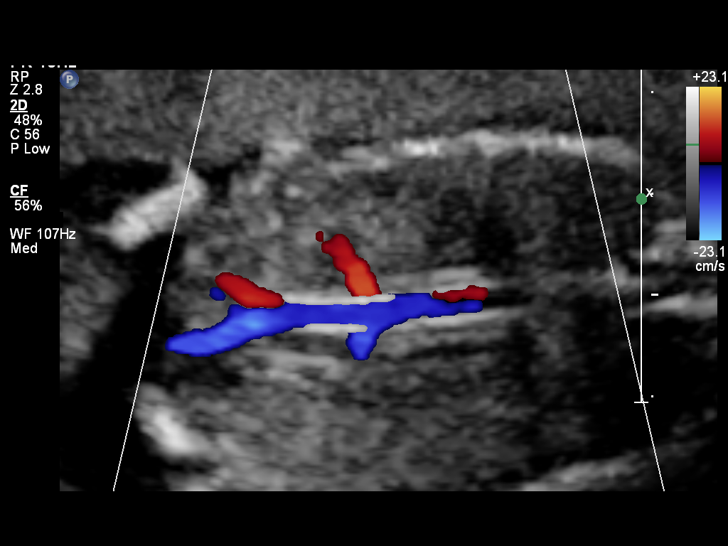

[12 of 28 positions shown; findings below may reference images not displayed]

OBSTETRICS REPORT
                      (Signed Final 04/18/2012 [DATE])

Service(s) Provided

 US OB DETAIL + 14 WK                                  76811.0
Indications

 Detailed fetal anatomic survey
 Previous cesarean section
 Hypothyroid
Fetal Evaluation

 Num Of Fetuses:    1
 Fetal Heart Rate:  140                         bpm
 Cardiac Activity:  Observed
 Presentation:      Cephalic
 Placenta:          Anterior, above cervical os
 P. Cord            Visualized, central
 Insertion:

 Amniotic Fluid
 AFI FV:      Subjectively within normal limits
                                             Larg Pckt:     5.1  cm
Biometry

 BPD:     45.8  mm    G. Age:   19w 6d                CI:        69.41   70 - 86
                                                      FL/HC:      17.7   16.1 -

 HC:     175.5  mm    G. Age:   20w 0d       72  %    HC/AC:      1.19   1.09 -

 AC:     147.2  mm    G. Age:   20w 0d       64  %    FL/BPD:
 FL:      31.1  mm    G. Age:   19w 4d       51  %    FL/AC:      21.1   20 - 24
 HUM:     28.9  mm    G. Age:   19w 3d       50  %
 CER:     21.2  mm    G. Age:   20w 1d       64  %
 NFT:     3.82  mm
 Est. FW:     317  gm    0 lb 11 oz      53  %
Gestational Age

 LMP:           19w 3d       Date:   12/04/11                 EDD:   09/09/12
 U/S Today:     19w 6d                                        EDD:   09/06/12
 Best:          19w 3d    Det. By:   LMP  (12/04/11)          EDD:   09/09/12
2nd Trimester Genetic Sonogram - Trisomy 21 Screening
 Age:                                             31          Risk=1:   481

 Structural anomalies (inc. cardiac):             No
 Echogenic bowel:                                 No
 Hypoplastic / absent midphalanx 5th Digit:       No
 Pyelectasis:                                     No
 2-vessel umbilical cord:                         No
 Echogenic cardiac foci:                          Yes
Anatomy

 Cranium:          Appears normal         Aortic Arch:      Appears normal
 Fetal Cavum:      Appears normal         Ductal Arch:      Appears normal
 Ventricles:       Appears normal         Diaphragm:        Appears normal
 Choroid Plexus:   Appears normal         Stomach:          Appears normal, left
                                                            sided
 Cerebellum:       Appears normal         Abdomen:          Appears normal
 Posterior Fossa:  Appears normal         Abdominal Wall:   Appears nml (cord
                                                            insert, abd wall)
 Nuchal Fold:      Appears normal         Cord Vessels:     Appears normal (3
                                                            vessel cord)
 Face:             Appears normal         Kidneys:          Appear normal
                   (orbits and profile)
 Lips:             Appears normal         Bladder:          Appears normal
 Heart:            Echogenic focus        Spine:            Appears normal
                   in LV
 RVOT:             Appears normal         Lower             Appears normal
                                          Extremities:
 LVOT:             Appears normal         Upper             Appears normal
                                          Extremities:

 Other:  Fetus appears to be a male. Heels and 5th digit visualized. Nasal
         bone visualized. Technically difficult due to fetal position.
Targeted Anatomy

 Fetal Central Nervous System
 Lat. Ventricles:  6.4                    Cisterna Magna:
Cervix Uterus Adnexa

 Cervical Length:   3.11      cm

 Cervix:       Normal appearance by transabdominal scan.
 Left Ovary:   Within normal limits.
 Right Ovary:  Within normal limits.

 Adnexa:     No abnormality visualized.
Impression

 Single living IUP.  US EGA is concordant with LMP.
 No fetal anomalies seen involving visualized anatomy.
 Echogenic intracardiac focus noted, which is a soft marker for
 Trisomy 21.
Recommendations

 Assessment of clinical risk factors and correlation with results
 of any 1st and 2nd trimester aneuploidy screening tests are
 recommended.  If patient is considered low-risk, this isolated
 finding is of unlikely significance.  If patient has other risk
 factors for aneuploidy, genetic counseling and further testing
 should be considered.  This could be scheduled at the
 7167.

## 2013-10-10 ENCOUNTER — Other Ambulatory Visit: Payer: Self-pay | Admitting: Family Medicine

## 2013-12-23 ENCOUNTER — Telehealth: Payer: Self-pay | Admitting: Family Medicine

## 2013-12-23 DIAGNOSIS — E018 Other iodine-deficiency related thyroid disorders and allied conditions: Secondary | ICD-10-CM

## 2013-12-23 NOTE — Telephone Encounter (Signed)
Pt will like you order TSH for her last time pt check it was 06/03/014.  Thank You   Marines

## 2013-12-23 NOTE — Telephone Encounter (Signed)
Will order; patient needs to schedule a follow up visit as well (last visit June 2014).   JB

## 2013-12-25 NOTE — Telephone Encounter (Signed)
Marines, please call this patient and have her schedule a lab visit for TSH and a follow up appointment with Dr Mauricio PoBreen.[name], Rodena Medinobert Lee

## 2013-12-31 ENCOUNTER — Other Ambulatory Visit (INDEPENDENT_AMBULATORY_CARE_PROVIDER_SITE_OTHER): Payer: Self-pay

## 2013-12-31 DIAGNOSIS — E018 Other iodine-deficiency related thyroid disorders and allied conditions: Secondary | ICD-10-CM

## 2013-12-31 LAB — TSH: TSH: 3.292 u[IU]/mL (ref 0.350–4.500)

## 2013-12-31 NOTE — Progress Notes (Signed)
TSH DONE TODAY Sandra [name] 

## 2014-01-16 ENCOUNTER — Other Ambulatory Visit: Payer: Self-pay | Admitting: Family Medicine

## 2014-01-29 ENCOUNTER — Encounter: Payer: Self-pay | Admitting: Family Medicine

## 2014-02-13 ENCOUNTER — Other Ambulatory Visit: Payer: Self-pay | Admitting: Family Medicine

## 2014-02-24 ENCOUNTER — Other Ambulatory Visit: Payer: Self-pay | Admitting: Family Medicine

## 2014-03-25 ENCOUNTER — Other Ambulatory Visit: Payer: Self-pay | Admitting: Family Medicine

## 2014-04-27 ENCOUNTER — Encounter: Payer: Self-pay | Admitting: Family Medicine

## 2014-10-12 ENCOUNTER — Encounter: Payer: Self-pay | Admitting: Family Medicine

## 2014-10-12 ENCOUNTER — Ambulatory Visit (INDEPENDENT_AMBULATORY_CARE_PROVIDER_SITE_OTHER): Payer: Self-pay | Admitting: Family Medicine

## 2014-10-12 VITALS — BP 110/76 | HR 62 | Temp 98.1°F | Ht 61.0 in | Wt 159.8 lb

## 2014-10-12 DIAGNOSIS — F32A Depression, unspecified: Secondary | ICD-10-CM | POA: Insufficient documentation

## 2014-10-12 DIAGNOSIS — F329 Major depressive disorder, single episode, unspecified: Secondary | ICD-10-CM

## 2014-10-12 DIAGNOSIS — E018 Other iodine-deficiency related thyroid disorders and allied conditions: Secondary | ICD-10-CM

## 2014-10-12 LAB — TSH: TSH: 2.395 u[IU]/mL (ref 0.350–4.500)

## 2014-10-12 NOTE — Assessment & Plan Note (Signed)
No symptoms of hypothyroidism. Due for recheck of TSH -lab work completed today

## 2014-10-12 NOTE — Patient Instructions (Signed)
It was nice to meet you today.  Dr. Randolm IdolFletke will call you with your lab results.  Please return if you have additional depressive thoughts.

## 2014-10-12 NOTE — Assessment & Plan Note (Signed)
Patient reports depressive symptoms after birth of most recent child. No depressive symptoms now however quick to anger. Denies depression and anhedonia. -suspect that symptoms related to having 3 children and husband at home -encouraged patient to find time for herself and to continue hobbies -encouraged to return if symptoms persist or worsen

## 2014-10-12 NOTE — Progress Notes (Signed)
   Subjective:    Patient ID: Sandra [name], female    DOB: [birth year], 34 y.o.   MRN: [id number]  HPI 34 y/o female presents for hypothyroidism s/p radioactive iodine ablation.   Hypothyroidism - taking synthroid 125 mcg daily, no side effects, no constipation, some dry skin, no heat/cold intolerance  Quick to anger - patient reports that she is often quick to anger, denies depressive symptoms currently however does admit to some depression after the birth of her third son, no anhedonia, poor sleep due to toddler at home, no changes in concentration, no weight changes, no homicidal or suicidal thoughts   Review of Systems  Constitutional: Negative for fever, chills and fatigue.  Gastrointestinal: Negative for vomiting, diarrhea and constipation.  See above     Objective:   Physical Exam Vitals: reviewed Gen: pleasant Hispanic female, two sons present, spanish interpretor via telephone Cardiac; RRR, S1 and S2 present, no murmur, no heaves/thrills Resp: CTAB, normal effort Psych: dressed appropriately, normal affect, no SI, no HI, no tangential thoughts, does not appear to be reacting to internal stimuli     Assessment & Plan:  Please see problem specific assessment and plan.

## 2014-10-13 ENCOUNTER — Encounter: Payer: Self-pay | Admitting: Family Medicine

## 2014-11-21 ENCOUNTER — Other Ambulatory Visit: Payer: Self-pay | Admitting: Family Medicine

## 2015-05-26 ENCOUNTER — Other Ambulatory Visit: Payer: Self-pay | Admitting: Family Medicine

## 2015-10-07 ENCOUNTER — Encounter: Payer: Self-pay | Admitting: Family Medicine

## 2015-10-07 ENCOUNTER — Ambulatory Visit (INDEPENDENT_AMBULATORY_CARE_PROVIDER_SITE_OTHER): Payer: Self-pay | Admitting: Family Medicine

## 2015-10-07 VITALS — BP 118/76 | HR 61 | Temp 98.0°F | Ht 61.0 in | Wt 160.0 lb

## 2015-10-07 DIAGNOSIS — H0520 Unspecified exophthalmos: Secondary | ICD-10-CM

## 2015-10-07 DIAGNOSIS — E018 Other iodine-deficiency related thyroid disorders and allied conditions: Secondary | ICD-10-CM

## 2015-10-07 LAB — TSH: TSH: 0.96 m[IU]/L

## 2015-10-07 NOTE — Progress Notes (Signed)
   Subjective:    Patient ID: Sandra [name], female    DOB: 1981/03/17, 35 y.o.   MRN: [id number]  HPI 35 y/o female presents for follow up thyroid.  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     Objective:   Physical Exam BP 118/76 mmHg  Pulse 61  Temp(Src) 98 F (36.7 C) (Oral)  Ht 5\' 1"  (1.549 m)  Wt 160 lb (72.576 kg)  BMI 30.25 kg/m2  Gen: pleasant female, NAD HEENT: normocephalic, bilateral exophthalmos (left greater than right) Cardiac: RRR, S1 and S2 present, no murmur Resp: CTAB, normal effort       Assessment & Plan:  HYPOTHYROIDISM, POST-RADIOACTIVE IODINE Patient presents for thyroid check. Physical symptoms stable. 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 Stable -routine follow up with ophthalmologist  Patient does not currently have medical insurance, she is applying for financial assistance. Will hold on additional labs at this time.

## 2015-10-07 NOTE — Patient Instructions (Signed)
It was nice to see you today.  I will call you with your lab results.

## 2015-10-07 NOTE — Assessment & Plan Note (Signed)
Patient presents for thyroid check. Physical symptoms stable. -TSH today

## 2015-10-07 NOTE — Assessment & Plan Note (Signed)
Bilateral eyes (left > right). Stable -routine follow up with ophthalmologist

## 2015-10-11 ENCOUNTER — Encounter: Payer: Self-pay | Admitting: Family Medicine

## 2015-11-22 ENCOUNTER — Other Ambulatory Visit: Payer: Self-pay | Admitting: Family Medicine

## 2016-05-20 ENCOUNTER — Other Ambulatory Visit: Payer: Self-pay | Admitting: Family Medicine

## 2016-10-09 NOTE — Progress Notes (Signed)
36 y.o. year old female presents for well woman/preventative visit.  Acute Concerns: Hx. Hypothyroidism s/p ablation (history of Graves) - prescribed levothyroxine 125 mg daily, still taking daily, feels like gaining weight, no changes in bowels, no changes in skin  Occasional swelling - worse with salty meal, hands and feet, improved in the AM, no shortness of breath, no chest pain  Diet: breakfast: herbalife shake during week, goes out over the weekend; Lunch - sandwhich or tortilla sandwhich; Dinner - at home, rice + meat/chicken + salad; 0-1 fruits; 1-2 vegetables; yogurt a few times per week   Exercise: no regular exercise, walks occasionally  Sexual/Birth History: 3 children; regular sex with husband, no issues in bedroom; regular periods - every month, last 4 days, no bleeding between periods  Birth Control: No birth control, natural family planning  POA/Living Will: No current, would like husband to be POA  Social:  Social History   Social History  . Marital status: Married    Spouse name: N/A  . Number of children: N/A  . Years of education: N/A   Social History Main Topics  . Smoking status: Never Smoker  . Smokeless tobacco: Not on file  . Alcohol use No  . Drug use: No  . Sexual activity: Yes   Other Topics Concern  . Not on file   Social History Narrative  . No narrative on file    Immunization: Immunization History  Administered Date(s) Administered  . Influenza Split 03/05/2012  . Tdap 10/20/2010    Cancer Screening:  Pap Smear: Due, Last 2013 (Negative for intraepithelial lesion, no HPV testing) - declined Pap Smear today  Mammogram: Not a candidate, no family members with breast cancer, no changes in breasts  Colonoscopy: Not a candidate, no FM of colon cancer  Dexa:Not a candidate    Physical Exam: VITALS: Reviewed GEN: Pleasant female, NAD HEENT: Normocephalic, PERRL, EOMI, no scleral icterus, bilateral TM pearly grey, nasal septum midline,  MMM, uvula midline, no anterior or posterior lymphadenopathy, no thyromegaly CARDIAC:RRR, S1 and S2 present, no murmur, no heaves/thrills RESP: CTAB, normal effort BREAST:Deferred ABD: soft, no tenderness, normal bowel sounds GU/GYN:Deferred EXT: No edema, 2+ radial and DP pulses SKIN: no rash  ASSESSMENT & PLAN: 36 y.o. female presents for annual well woman/preventative exam. Please see problem specific assessment and plan.   Healthcare maintenance 36 y/o female presents for well woman/preventative visit.  -up to date on immunizations -declined Pap Smear today, otherwise no further cancer screening recommended at this time -encouraged healthy eating habits and regular exercise -discussed birth control, patient to discuss with husband if they wish to start BC  HYPOTHYROIDISM, POST-RADIOACTIVE IODINE Due for recheck of TSH -continue Synthroid 125 mcg (adjust based on TSH results)

## 2016-10-13 ENCOUNTER — Ambulatory Visit (INDEPENDENT_AMBULATORY_CARE_PROVIDER_SITE_OTHER): Payer: Self-pay | Admitting: Family Medicine

## 2016-10-13 DIAGNOSIS — Z0000 Encounter for general adult medical examination without abnormal findings: Secondary | ICD-10-CM

## 2016-10-13 DIAGNOSIS — E018 Other iodine-deficiency related thyroid disorders and allied conditions: Secondary | ICD-10-CM

## 2016-10-13 NOTE — Assessment & Plan Note (Signed)
Due for recheck of TSH -continue Synthroid 125 mcg (adjust based on TSH results)

## 2016-10-13 NOTE — Patient Instructions (Addendum)
It was nice to see you today.  Dr. Randolm Idol will check some labs today and call you with the results.  I recommend that you attempt to eat more fruits and vegetables that you eat. Consider asking the [employer] how many calories are in each shake. I recommend that you attempt to walk 3-4 times per week for 30-45 minutes.   Please speak to your husband about birth control. If you would like to discuss birth control in more detail please a follow up appointment.

## 2016-10-13 NOTE — Assessment & Plan Note (Signed)
36 y/o female presents for well woman/preventative visit.  -up to date on immunizations -declined Pap Smear today, otherwise no further cancer screening recommended at this time -encouraged healthy eating habits and regular exercise -discussed birth control, patient to discuss with husband if they wish to start [facility]

## 2016-10-14 LAB — CMP14+EGFR
A/G RATIO: 1.5 (ref 1.2–2.2)
ALT: 42 IU/L — ABNORMAL HIGH (ref 0–32)
AST: 18 IU/L (ref 0–40)
Albumin: 4.4 g/dL (ref 3.5–5.5)
Alkaline Phosphatase: 98 IU/L (ref 39–117)
BUN/Creatinine Ratio: 16 (ref 9–23)
BUN: 13 mg/dL (ref 6–20)
Bilirubin Total: 0.5 mg/dL (ref 0.0–1.2)
CALCIUM: 8.7 mg/dL (ref 8.7–10.2)
CO2: 22 mmol/L (ref 18–29)
Chloride: 105 mmol/L (ref 96–106)
Creatinine, Ser: 0.79 mg/dL (ref 0.57–1.00)
GFR calc non Af Amer: 97 mL/min/{1.73_m2} (ref >59)
GFR, EST AFRICAN AMERICAN: 111 mL/min/{1.73_m2} (ref >59)
GLOBULIN, TOTAL: 2.9 g/dL (ref 1.5–4.5)
Glucose: 85 mg/dL (ref 65–99)
POTASSIUM: 4.1 mmol/L (ref 3.5–5.2)
SODIUM: 140 mmol/L (ref 134–144)
TOTAL PROTEIN: 7.3 g/dL (ref 6.0–8.5)

## 2016-10-14 LAB — CBC
HEMATOCRIT: 38.9 % (ref 34.0–46.6)
Hemoglobin: 13.3 g/dL (ref 11.1–15.9)
MCH: 31.6 pg (ref 26.6–33.0)
MCHC: 34.2 g/dL (ref 31.5–35.7)
MCV: 92 fL (ref 79–97)
Platelets: 249 10*3/uL (ref 150–379)
RBC: 4.21 x10E6/uL (ref 3.77–5.28)
RDW: 13 % (ref 12.3–15.4)
WBC: 5.2 10*3/uL (ref 3.4–10.8)

## 2016-10-14 LAB — LIPID PANEL
Chol/HDL Ratio: 3.7 ratio (ref 0.0–4.4)
Cholesterol, Total: 138 mg/dL (ref 100–199)
HDL: 37 mg/dL — AB (ref >39)
LDL Calculated: 88 mg/dL (ref 0–99)
Triglycerides: 65 mg/dL (ref 0–149)
VLDL Cholesterol Cal: 13 mg/dL (ref 5–40)

## 2016-10-14 LAB — TSH: TSH: 2.73 u[IU]/mL (ref 0.450–4.500)

## 2016-10-16 ENCOUNTER — Telehealth: Payer: Self-pay | Admitting: Family Medicine

## 2016-10-16 ENCOUNTER — Other Ambulatory Visit: Payer: Self-pay | Admitting: Family Medicine

## 2016-10-16 DIAGNOSIS — R740 Nonspecific elevation of levels of transaminase and lactic acid dehydrogenase [LDH]: Principal | ICD-10-CM

## 2016-10-16 DIAGNOSIS — R7401 Elevation of levels of liver transaminase levels: Secondary | ICD-10-CM | POA: Insufficient documentation

## 2016-10-16 NOTE — Telephone Encounter (Signed)
Discussed normal cbc, lipid, and cmp except mildly elevated ALT.   RN staff - please call patient to schedule lab appointment in one month. I will place order for repeat CMP in separate orders only encounter.

## 2016-10-17 ENCOUNTER — Telehealth: Payer: Self-pay | Admitting: *Deleted

## 2016-10-17 NOTE — Telephone Encounter (Signed)
Called pt and scheduled her for an appt. Alyaan Budzynski, CMA  

## 2016-11-15 ENCOUNTER — Other Ambulatory Visit: Payer: Self-pay

## 2016-11-15 DIAGNOSIS — R740 Nonspecific elevation of levels of transaminase and lactic acid dehydrogenase [LDH]: Principal | ICD-10-CM

## 2016-11-15 DIAGNOSIS — R7401 Elevation of levels of liver transaminase levels: Secondary | ICD-10-CM

## 2016-11-16 LAB — SPECIMEN STATUS

## 2016-11-16 LAB — CMP14+EGFR
ALBUMIN: 4.5 g/dL (ref 3.5–5.5)
ALK PHOS: 90 IU/L (ref 39–117)
ALT: 33 IU/L — ABNORMAL HIGH (ref 0–32)
AST: 20 IU/L (ref 0–40)
Albumin/Globulin Ratio: 1.7 (ref 1.2–2.2)
BILIRUBIN TOTAL: 0.3 mg/dL (ref 0.0–1.2)
BUN/Creatinine Ratio: 16 (ref 9–23)
BUN: 14 mg/dL (ref 6–20)
CHLORIDE: 104 mmol/L (ref 96–106)
CO2: 22 mmol/L (ref 18–29)
Calcium: 8.9 mg/dL (ref 8.7–10.2)
Creatinine, Ser: 0.89 mg/dL (ref 0.57–1.00)
GFR calc Af Amer: 96 mL/min/{1.73_m2} (ref >59)
GFR calc non Af Amer: 84 mL/min/{1.73_m2} (ref >59)
GLUCOSE: 89 mg/dL (ref 65–99)
Globulin, Total: 2.6 g/dL (ref 1.5–4.5)
Potassium: 4 mmol/L (ref 3.5–5.2)
Sodium: 140 mmol/L (ref 134–144)
Total Protein: 7.1 g/dL (ref 6.0–8.5)

## 2016-11-20 ENCOUNTER — Other Ambulatory Visit: Payer: Self-pay | Admitting: Family Medicine

## 2016-11-21 ENCOUNTER — Encounter: Payer: Self-pay | Admitting: Family Medicine

## 2016-11-21 DIAGNOSIS — R740 Nonspecific elevation of levels of transaminase and lactic acid dehydrogenase [LDH]: Principal | ICD-10-CM

## 2016-11-21 DIAGNOSIS — R7401 Elevation of levels of liver transaminase levels: Secondary | ICD-10-CM

## 2016-11-21 NOTE — Assessment & Plan Note (Signed)
Improved to 32 on recheck. No further workup planned at this time.

## 2017-05-23 ENCOUNTER — Other Ambulatory Visit: Payer: Self-pay | Admitting: *Deleted

## 2017-05-25 MED ORDER — LEVOTHYROXINE SODIUM 125 MCG PO TABS: 125.0000 ug | ORAL_TABLET | Freq: Every day | ORAL | 0 refills | Status: DC

## 2017-05-28 ENCOUNTER — Other Ambulatory Visit: Payer: Self-pay | Admitting: Family Medicine

## 2017-06-01 ENCOUNTER — Telehealth: Payer: Self-pay | Admitting: *Deleted

## 2017-06-01 NOTE — Telephone Encounter (Signed)
Scheduled patient for 06/05/17 to follow up on her thyroid and a new complaint of dizziness.  Patient recently seen for her well woman in April 2018. Drexler Maland,CMA

## 2017-06-05 ENCOUNTER — Ambulatory Visit: Payer: Self-pay | Admitting: Family Medicine

## 2017-06-06 ENCOUNTER — Other Ambulatory Visit: Payer: Self-pay

## 2017-06-06 ENCOUNTER — Ambulatory Visit (INDEPENDENT_AMBULATORY_CARE_PROVIDER_SITE_OTHER): Payer: Self-pay | Admitting: Family Medicine

## 2017-06-06 ENCOUNTER — Other Ambulatory Visit (HOSPITAL_COMMUNITY)
Admission: RE | Admit: 2017-06-06 | Discharge: 2017-06-06 | Disposition: A | Discharge status: Home / Self Care | Payer: Self-pay | Source: Ambulatory Visit | Attending: Family Medicine | Admitting: Family Medicine

## 2017-06-06 ENCOUNTER — Encounter: Payer: Self-pay | Admitting: Family Medicine

## 2017-06-06 VITALS — BP 124/70 | HR 65 | Temp 98.8°F | Ht 61.0 in | Wt 166.0 lb

## 2017-06-06 DIAGNOSIS — E018 Other iodine-deficiency related thyroid disorders and allied conditions: Secondary | ICD-10-CM

## 2017-06-06 DIAGNOSIS — Z124 Encounter for screening for malignant neoplasm of cervix: Secondary | ICD-10-CM

## 2017-06-06 MED ORDER — LEVOTHYROXINE SODIUM 125 MCG PO TABS: 125.0000 ug | ORAL_TABLET | Freq: Every day | ORAL | 3 refills | Status: DC

## 2017-06-06 MED ORDER — LEVOTHYROXINE SODIUM 125 MCG PO TABS: 125.0000 ug | ORAL_TABLET | Freq: Every day | ORAL | 0 refills | Status: DC

## 2017-06-06 NOTE — Progress Notes (Signed)
   Subjective:    Patient ID: Sandra AdjutantMarina [name] is a 36 y.o. female presenting with Hypothyroidism and Nausea (dizziness and nausea)  on 06/06/2017  HPI: Ran out of her thyroid meds and has not had any for 5 days. She notes feeling dizzy and an empty feeling and nausea in her stomach. Went to Urgent care and they gave her more meds. Notes symptoms are better since re-starting her meds about 1 week again. Last TSH 4/18-WNL--stable dosing since 6 years. No pap since 2013. Does not desire pregnancy. Offered contraception, but she is uninterested at this time.   Review of Systems  Constitutional: Negative for chills and fever.  Respiratory: Negative for shortness of breath.   Cardiovascular: Negative for chest pain.  Gastrointestinal: Positive for nausea. Negative for abdominal pain and vomiting.  Genitourinary: Negative for dysuria.  Skin: Negative for rash.  Neurological: Positive for dizziness.      Objective:    BP 124/70   Pulse 65   Temp 98.8 F (37.1 C) (Oral)   Ht 5\' 1"  (1.549 m)   Wt 166 lb (75.3 kg)   LMP 05/16/2017 (Approximate)   SpO2 99%   BMI 31.37 kg/m LMP 2 wks ago Physical Exam  Constitutional: She is oriented to person, place, and time. She appears well-developed and well-nourished. No distress.  HENT:  Head: Normocephalic and atraumatic.  Eyes: No scleral icterus.  Neck: Neck supple.  Cardiovascular: Normal rate.  Pulmonary/Chest: Effort normal.  Abdominal: Soft.  Neurological: She is alert and oriented to person, place, and time.  Skin: Skin is warm and dry.  Psychiatric: She has a normal mood and affect.  BUS normal, vagina is pink and rugated, cervix is multiparous without lesion, uterus is small and anteverted, no adnexal mass or tenderness.     Assessment & Plan:   Problem List Items Addressed This Visit      Unprioritized   HYPOTHYROIDISM, POST-RADIOACTIVE IODINE - Primary    Refilled her meds      Relevant Medications   levothyroxine  (SYNTHROID, LEVOTHROID) 125 MCG tablet    Other Visit Diagnoses    Screening for malignant neoplasm of cervix       Pap update   Relevant Orders   Cytology - PAP [place]      Total face-to-face time with patient: 15 minutes. Over 50% of encounter was spent on counseling and coordination of care. Return in about 1 year (around 06/06/2018).  Reva Boresanya S Corley Maffeo 06/06/2017 4:26 PM

## 2017-06-06 NOTE — Assessment & Plan Note (Signed)
Refilled her meds. 

## 2017-06-06 NOTE — Patient Instructions (Signed)
Hipotiroidismo (Hypothyroidism) El hipotiroidismo es un trastorno de la tiroides, una glndula grande ubicada en la parte anterior e inferior del cuello. La tiroides libera hormonas que controlan el funcionamiento del organismo. En los casos de hipotiroidismo, la glndula no produce la cantidad suficiente de estas hormonas. CAUSAS Las causas del hipotiroidismo pueden incluir lo siguiente:  Infecciones virales.  Embarazo.  Un ataque del sistema de defensa (sistema inmunitario) a la tiroides.  Algunos medicamentos.  Defectos de nacimiento.  Radioterapias anteriores en la cabeza o el cuello.  Tratamiento previo con yodo radioactivo.  Extirpacin quirrgica previa de una parte o de toda la tiroides.  Problemas con la glndula ubicada en el centro del cerebro (hipfisis). SIGNOS Y SNTOMAS Los signos y los sntomas de hipotiroidismo pueden ser los siguientes:  Sensacin de falta de energa (letargo).  Incapacidad para tolerar el fro.  Aumento de peso que no puede explicarse por un cambio en la dieta o en los hbitos de ejercicio fsico.  Piel seca.  Pelo grueso.  Irregularidades menstruales.  Ralentizacin de los procesos de pensamiento.  Estreimiento.  Tristeza o depresin. DIAGNSTICO El mdico puede diagnosticar el hipotiroidismo con anlisis de sangre y ecografas. TRATAMIENTO El hipotiroidismo se trata con medicamentos que reemplazan las hormonas que el cuerpo no produce. Despus de comenzar el tratamiento, pueden pasar varias semanas hasta la desaparicin de los sntomas. INSTRUCCIONES PARA EL CUIDADO EN EL HOGAR  Tome los medicamentos solamente como se lo haya indicado el mdico.  Si empieza a tomar medicamentos nuevos, infrmele al mdico.  Concurra a todas las visitas de control como se lo haya indicado el mdico. Esto es importante. A medida que la enfermedad mejora, es posible que haya que modificar las dosis. Tendr que hacerse anlisis de sangre  peridicamente, de modo que el mdico pueda controlar la enfermedad.  SOLICITE ATENCIN MDICA SI:  Los sntomas no mejoran con el tratamiento.  Est tomando medicamentos sustitutivos de la tiroides y: ? Suda en exceso. ? Siente temblores. ? Est ansioso. ? Baja de peso rpidamente. ? No puede tolerar el calor. ? Tiene cambios emocionales. ? Tiene diarrea. ? Se siente dbil.  SOLICITE ATENCIN MDICA DE INMEDIATO SI:  Siente dolor en el pecho.  Tiene latidos cardacos irregulares o siente dolor en el pecho.  Nota que la frecuencia cardaca est acelerada.  Esta informacin no tiene como fin reemplazar el consejo del mdico. Asegrese de hacerle al mdico cualquier pregunta que tenga. Document Released: 06/12/2005 Document Revised: 07/03/2014 Document Reviewed: 10/28/2013 Elsevier Interactive Patient Education  2017 Elsevier Inc.  

## 2017-06-08 LAB — CYTOLOGY - PAP: DIAGNOSIS: NEGATIVE

## 2017-09-25 ENCOUNTER — Encounter: Payer: Self-pay | Admitting: Internal Medicine

## 2017-09-25 ENCOUNTER — Ambulatory Visit (INDEPENDENT_AMBULATORY_CARE_PROVIDER_SITE_OTHER): Payer: Self-pay | Admitting: Internal Medicine

## 2017-09-25 ENCOUNTER — Other Ambulatory Visit: Payer: Self-pay

## 2017-09-25 VITALS — BP 122/64 | HR 64 | Temp 97.5°F | Wt 164.0 lb

## 2017-09-25 DIAGNOSIS — E890 Postprocedural hypothyroidism: Secondary | ICD-10-CM | POA: Insufficient documentation

## 2017-09-25 DIAGNOSIS — E018 Other iodine-deficiency related thyroid disorders and allied conditions: Secondary | ICD-10-CM

## 2017-09-25 NOTE — Patient Instructions (Signed)
Fue Psychiatristun placer conocerte! Revisaremos su tiroides hoy y lo llamar con sus resultados de laboratorio.  -Dr. Nancy MarusMayo

## 2017-09-25 NOTE — Assessment & Plan Note (Signed)
Doing well on current Synthyroid dose. Last TSH 10/13/16 was 2.730. - Continue Synthroid 125mcg daily - Check TSH, T4 - Will call patient with results - Discussed starting a medication for constipation- patient states she prefers to eat high fiber diet and drink plenty of water. - Follow-up in 6 months

## 2017-09-25 NOTE — Progress Notes (Signed)
   [facility] Phone: [phone]  Subjective:  Jearld AdjutantMarina is a 37 year old female presenting to clinic for follow-up of her hypothyroidism. She hasn't had her thyroid level checked in ~1 year, so she thought it might be time to get this checked. Taking Synthroid 125mcg daily. No side effects. Endorses chronic constipation, for which she tries to eat a high fiber diet. Denies weight gain, dry skin, cold intolerance.   ROS: See HPI for pertinent positives and negatives  Past Medical History- hypothyroidism s/p radioactive iodine, depression  Family history reviewed for today's visit. No changes.  Social history- patient is a never smoker.  Objective: BP 122/64   Pulse 64   Temp (!) 97.5 F (36.4 C) (Oral)   Wt 164 lb (74.4 kg)   LMP 09/12/2017   SpO2 99%   BMI 30.99 kg/m  Gen: NAD, alert, cooperative with exam CV: RRR, no murmur Resp: CTABL, no wheezes, normal work of breathing GI: SNTND, BS present, no guarding or organomegaly  Assessment/Plan: Hypothyroidism s/p radioactive iodine: Doing well on current Synthyroid dose. Last TSH 10/13/16 was 2.730. - Continue Synthroid 125mcg daily - Check TSH, T4 - Will call patient with results - Discussed starting a medication for constipation- patient states she prefers to eat high fiber diet and drink plenty of water. - Follow-up in 6 months  Willadean CarolKaty Mayo, MD PGY-3

## 2017-09-26 ENCOUNTER — Telehealth: Payer: Self-pay | Admitting: Internal Medicine

## 2017-09-26 LAB — T4, FREE: Free T4: 1.68 ng/dL (ref 0.82–1.77)

## 2017-09-26 LAB — TSH: TSH: 1 u[IU]/mL (ref 0.450–4.500)

## 2017-09-26 NOTE — Telephone Encounter (Signed)
Called patient using [employer] to discuss her lab results. Had to leave a voicemail. Thyroid tests were normal and she should continue her current Synthroid dose. Patient should call back with any questions.  Sandra CarolKaty Mayo, MD PGY-3

## 2018-01-10 ENCOUNTER — Other Ambulatory Visit: Payer: Self-pay | Admitting: Family Medicine

## 2018-07-08 ENCOUNTER — Other Ambulatory Visit: Payer: Self-pay

## 2018-07-08 MED ORDER — LEVOTHYROXINE SODIUM 125 MCG PO TABS: 125.0000 ug | ORAL_TABLET | Freq: Every day | ORAL | 0 refills | Status: DC

## 2018-07-11 ENCOUNTER — Telehealth: Payer: Self-pay

## 2018-07-11 NOTE — Telephone Encounter (Signed)
-----  Message from Kinnie Feil, MD sent at 07/08/2018  3:33 PM EST ----- Please help patient schedule f/u with me for hypothyroidism and to meet me. I had been her PCP since 2018 and I have never met her. I will fill her Synthroid for 1 month to give time for scheduling. I will not continue her medication refill without a visit. Thanks.

## 2018-07-11 NOTE — Telephone Encounter (Signed)
Using [employer] contacted patient and scheduled with pcp.

## 2018-07-19 ENCOUNTER — Ambulatory Visit (INDEPENDENT_AMBULATORY_CARE_PROVIDER_SITE_OTHER): Payer: Self-pay | Admitting: Family Medicine

## 2018-07-19 ENCOUNTER — Telehealth: Payer: Self-pay | Admitting: Family Medicine

## 2018-07-19 ENCOUNTER — Other Ambulatory Visit: Payer: Self-pay

## 2018-07-19 ENCOUNTER — Encounter: Payer: Self-pay | Admitting: Family Medicine

## 2018-07-19 VITALS — BP 108/72 | HR 82 | Temp 98.3°F | Ht 61.0 in | Wt 164.0 lb

## 2018-07-19 DIAGNOSIS — H0520 Unspecified exophthalmos: Secondary | ICD-10-CM

## 2018-07-19 DIAGNOSIS — R7401 Elevation of levels of liver transaminase levels: Secondary | ICD-10-CM

## 2018-07-19 DIAGNOSIS — R740 Nonspecific elevation of levels of transaminase and lactic acid dehydrogenase [LDH]: Secondary | ICD-10-CM

## 2018-07-19 DIAGNOSIS — E039 Hypothyroidism, unspecified: Secondary | ICD-10-CM

## 2018-07-19 DIAGNOSIS — R499 Unspecified voice and resonance disorder: Secondary | ICD-10-CM | POA: Insufficient documentation

## 2018-07-19 DIAGNOSIS — R748 Abnormal levels of other serum enzymes: Secondary | ICD-10-CM

## 2018-07-19 DIAGNOSIS — E018 Other iodine-deficiency related thyroid disorders and allied conditions: Secondary | ICD-10-CM

## 2018-07-19 MED ORDER — INFLUENZA VAC SUBUNIT QUAD IM SUSP: 0.5000 mL | Freq: Once | INTRAMUSCULAR | 0 refills | Status: AC

## 2018-07-19 MED ORDER — LEVOCETIRIZINE DIHYDROCHLORIDE 5 MG PO TABS: 5.0000 mg | ORAL_TABLET | Freq: Every evening | ORAL | 1 refills | Status: AC

## 2018-07-19 NOTE — Assessment & Plan Note (Signed)
TSH checked today. I will refill Synthroid once I get the result.

## 2018-07-19 NOTE — Progress Notes (Addendum)
  Subjective:     Patient ID: Sandra [name], female   DOB: [birth year], 38 y.o.   MRN: [id number] [employer] interpreter ID# [id number]  HPI Hypothyroidism: Here for follow-up. She is compliant with her meds. She need refill. Voice change: Started 3 months ago since she had a cold. Cold symptoms lasted 2-3 days but the throat is not getting better. She denies throat pain, but feels that her voice sound congested. Denies difficulty swallowing. Transaminates: No GI symptoms, here for f/u. 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 She was seen by a specialist who recommended observation. She denies eye pain, but her left eye gets watery at times. HM: Need flu shot.  Current Outpatient Medications on File Prior to Visit  Medication Sig Dispense Refill  . levothyroxine (SYNTHROID, LEVOTHROID) 125 MCG tablet Take 1 tablet (125 mcg total) by mouth daily. 30 tablet 0   No current facility-administered medications on file prior to visit.    Past Medical History:  Diagnosis Date  . Hypothyroid    Vitals:   07/19/18 1101  BP: 108/72  Pulse: 82  Temp: 98.3 F (36.8 C)  TempSrc: Oral  Weight: 164 lb (74.4 kg)  Height: 5\' 1"  (1.549 m)     Review of Systems  HENT: Positive for voice change. Negative for trouble swallowing.   Eyes: Negative for visual disturbance.  Cardiovascular: Negative.   Gastrointestinal: Negative.   Genitourinary: Negative.   Musculoskeletal: Negative.   Neurological: Negative.   All other systems reviewed and are negative.      Objective:   Physical Exam Vitals signs and nursing note reviewed.  Constitutional:      Appearance: Normal appearance. She is not ill-appearing or diaphoretic.  HENT:     Mouth/Throat:     Comments: Unfortunately I forgot to check her throat before she left Eyes:     Extraocular Movements: Extraocular movements intact.     Conjunctiva/sclera: Conjunctivae normal.     Pupils: Pupils are equal, round, and reactive to light.   Comments: Bulging left eye  Neck:     Musculoskeletal: Normal range of motion and neck supple. No neck rigidity or muscular tenderness.  Cardiovascular:     Rate and Rhythm: Normal rate and regular rhythm.     Pulses: Normal pulses.     Heart sounds: Normal heart sounds. No murmur.  Lymphadenopathy:     Cervical: No cervical adenopathy.  Neurological:     Mental Status: She is alert.    PATIENT RETURNED FOR HEENT EXAM: Throat: No oropharyngeal erythema or exudate. Tonsil and uvular appears normal.     Assessment:     Hypothyroidism Transaminates Exophthalmos Voice change Health maintenance.    Plan:     Check problem list.  Flu shot recommended. She prefers to get it at the pharmacy since she does not have insurance and she might get bills in the mail if we administer it. I also asked her to check lab pricing with the lab tech. She verbalized understanding.

## 2018-07-19 NOTE — Patient Instructions (Signed)
Influenza Virus Vaccine (Flucelvax)  Qu es este medicamento?  La VACUNA ANTIGRIPAL ayuda a disminuir el riesgo de contraer la influenza, tambin conocida como la gripe. La vacuna solo ayuda a protegerle contra algunas cepas de influenza.  Este medicamento puede ser utilizado para otros usos; si tiene alguna pregunta consulte con su proveedor de atencin mdica o con su farmacutico.  MARCAS COMUNES: FLUCELVAX  Qu le debo informar a mi profesional de la salud antes de tomar este medicamento?  Necesita saber si usted presenta alguno de los siguientes problemas o situaciones:  -trastorno de sangrado como hemofilia  -fiebre o infeccin  -sndrome de Guillain-Barre u otros problemas neurolgicos  -problemas del sistema inmunolgico  -infeccin por el virus de la inmunodeficiencia humana (VIH) o SIDA  -niveles bajos de plaquetas en la sangre  -esclerosis mltiple  -una reaccin alrgica o inusual a las vacunas antigripales, a otros medicamentos, alimentos, colorantes o conservantes  -si est embarazada o buscando quedar embarazada  -si est amamantando a un beb  Cmo debo utilizar este medicamento?  Esta vacuna se administra mediante inyeccin por va intramuscular. Lo administra un profesional de la salud.  Recibir una copia de informacin escrita sobre la vacuna antes de cada vacuna. Asegrese de leer este folleto cada vez cuidadosamente. Este folleto puede cambiar con frecuencia.  Hable con su pediatra para informarse acerca del uso de este medicamento en nios. Puede requerir atencin especial.  Sobredosis: Pngase en contacto inmediatamente con un centro toxicolgico o una sala de urgencia si usted cree que haya tomado demasiado medicamento.  ATENCIN: Este medicamento es solo para usted. No comparta este medicamento con nadie.  Qu sucede si me olvido de una dosis?  No se aplica en este caso.  Qu puede interactuar con este medicamento?  -quimioterapia o radioterapia  -medicamentos que suprimen el sistema  inmunolgico, tales como etanercept, anakinra, infliximab y adalimumab  -medicamentos que tratan o previenen cogulos sanguneos, como warfarina  -fenitona  -medicamentos esteroideos, como la prednisona o la cortisona  -teofilina  -vacunas  Puede ser que esta lista no menciona todas las posibles interacciones. Informe a su profesional de la salud de todos los productos a base de hierbas, medicamentos de venta libre o suplementos nutritivos que est tomando. Si usted fuma, consume bebidas alcohlicas o si utiliza drogas ilegales, indqueselo tambin a su profesional de la salud. Algunas sustancias pueden interactuar con su medicamento.  A qu debo estar atento al usar este medicamento?  Informe a su mdico o a su profesional de la salud sobre todos los efectos secundarios que persistan despus de 3 das. Llame a su proveedor de atencin mdica si se presentan sntomas inusuales dentro de las 6 semanas de recibir esta vacuna.  Es posible que todava pueda contraer la gripe, pero la enfermedad no ser tan fuerte como normalmente. No puede contraer la gripe de esta vacuna. La vacuna antigripal no le protege contra resfros u otras enfermedades que pueden causar fiebre. Debe vacunarse cada ao.  Qu efectos secundarios puedo tener al utilizar este medicamento?  Efectos secundarios que debe informar a su mdico o a su profesional de la salud tan pronto como sea posible:  -reacciones alrgicas como erupcin cutnea, picazn o urticarias, hinchazn de la cara, labios o lengua  Efectos secundarios que, por lo general, no requieren atencin mdica (debe informarlos a su mdico o a su profesional de la salud si persisten o si son molestos):  -fiebre  -dolor de cabeza  -molestias y dolores musculares  -dolor, sensibilidad, enrojecimiento   o hinchazn en el lugar de la inyeccin  -cansancio  Puede ser que esta lista no menciona todos los posibles efectos secundarios. Comunquese a su mdico por asesoramiento mdico sobre los  efectos secundarios. Usted puede informar los efectos secundarios a la FDA por telfono al 1-800-FDA-1088.  Dnde debo guardar mi medicina?  Esta vacuna se administrar por un profesional de la salud en una clnica, farmacia, consultorio mdico u otro consultorio de un profesional de la salud. No se le suministrar esta vacuna para guardar en su domicilio.  ATENCIN: Este folleto es un resumen. Puede ser que no cubra toda la posible informacin. Si usted tiene preguntas acerca de esta medicina, consulte con su mdico, su farmacutico o su profesional de la salud.   2019 Elsevier/Gold Standard (2011-05-29 16:29:16)

## 2018-07-19 NOTE — Assessment & Plan Note (Signed)
Cmet checked today. I will contact her with the result.

## 2018-07-19 NOTE — Telephone Encounter (Signed)
I called using [employer] interpreter ID# [phone].  I reminded her that I plan on examining her throat during the visit, but I forgot to do so given so many other things we talked about.  She is willing to return today between 2 and 3 PM. I will evaluate her throat then. I appreciate her flexibility.

## 2018-07-19 NOTE — Assessment & Plan Note (Signed)
She had been evaluated in the past my the ophthalmologist. No acute change. Monitor closely. F/U with ophthalmologist routinely.

## 2018-07-19 NOTE — Assessment & Plan Note (Signed)
Throat was not examined before she left. Likely allergy related. I recommended ENT referral for further eval vs trial of allergy medication. She prefers allergy meds for now. I will call her to come in for throat exam today or next week. Xyzal escribed.

## 2018-07-20 ENCOUNTER — Other Ambulatory Visit: Payer: Self-pay | Admitting: Family Medicine

## 2018-07-20 LAB — CMP14+EGFR
ALBUMIN: 4.5 g/dL (ref 3.8–4.8)
ALT: 35 IU/L — ABNORMAL HIGH (ref 0–32)
AST: 21 IU/L (ref 0–40)
Albumin/Globulin Ratio: 1.6 (ref 1.2–2.2)
Alkaline Phosphatase: 92 IU/L (ref 39–117)
BUN / CREAT RATIO: 30 — AB (ref 9–23)
BUN: 17 mg/dL (ref 6–20)
Bilirubin Total: 0.4 mg/dL (ref 0.0–1.2)
CO2: 20 mmol/L (ref 20–29)
CREATININE: 0.57 mg/dL (ref 0.57–1.00)
Calcium: 9.3 mg/dL (ref 8.7–10.2)
Chloride: 102 mmol/L (ref 96–106)
GFR calc non Af Amer: 118 mL/min/{1.73_m2} (ref >59)
GFR, EST AFRICAN AMERICAN: 136 mL/min/{1.73_m2} (ref >59)
Globulin, Total: 2.8 g/dL (ref 1.5–4.5)
Glucose: 73 mg/dL (ref 65–99)
Potassium: 4.1 mmol/L (ref 3.5–5.2)
Sodium: 138 mmol/L (ref 134–144)
TOTAL PROTEIN: 7.3 g/dL (ref 6.0–8.5)

## 2018-07-20 LAB — TSH: TSH: 0.479 u[IU]/mL (ref 0.450–4.500)

## 2018-07-20 MED ORDER — LEVOTHYROXINE SODIUM 125 MCG PO TABS: 125.0000 ug | ORAL_TABLET | Freq: Every day | ORAL | 2 refills | Status: DC

## 2018-07-24 ENCOUNTER — Telehealth: Payer: Self-pay | Admitting: Family Medicine

## 2018-07-24 NOTE — Telephone Encounter (Signed)
Test result discussed. May f/u CMEt in 1 year. Return soon if having GI symptoms. I answered all questions.  [employer] interpreter: ID # [id number]

## 2019-04-21 ENCOUNTER — Other Ambulatory Visit: Payer: Self-pay | Admitting: Family Medicine

## 2019-05-30 ENCOUNTER — Other Ambulatory Visit: Payer: Self-pay

## 2019-05-30 ENCOUNTER — Ambulatory Visit (INDEPENDENT_AMBULATORY_CARE_PROVIDER_SITE_OTHER): Payer: Self-pay | Admitting: Family Medicine

## 2019-05-30 ENCOUNTER — Encounter: Payer: Self-pay | Admitting: Family Medicine

## 2019-05-30 VITALS — BP 102/64 | HR 65 | Wt 165.6 lb

## 2019-05-30 DIAGNOSIS — E018 Other iodine-deficiency related thyroid disorders and allied conditions: Secondary | ICD-10-CM

## 2019-05-30 DIAGNOSIS — R7401 Elevation of levels of liver transaminase levels: Secondary | ICD-10-CM

## 2019-05-30 DIAGNOSIS — F329 Major depressive disorder, single episode, unspecified: Secondary | ICD-10-CM

## 2019-05-30 DIAGNOSIS — E039 Hypothyroidism, unspecified: Secondary | ICD-10-CM

## 2019-05-30 DIAGNOSIS — F32A Depression, unspecified: Secondary | ICD-10-CM

## 2019-05-30 NOTE — Progress Notes (Signed)
  Subjective:     Patient ID: Sandra [name], female   DOB: [birth year], 38 y.o.   MRN: [id number]  HPI Hypothyroidism: She is here for follow-up. She is compliant with Synthroid 125 mcg qd. Denies any concerns. She denies voice change. Depression: She denies depression. She said she was depressed in the past when her son who was 54 month old, had pneumonia and went into respiratory arrest and developed anoxic brain injury. He is now 39 years old and he is doing well receiving occupational therapy. She is in a good place now. Elevated Liver enzymes:Wanted to f/u on results. She denies GI symptoms.  Current Outpatient Medications on File Prior to Visit  Medication Sig Dispense Refill  . levothyroxine (SYNTHROID) 125 MCG tablet Take 1 tablet by mouth once daily 90 tablet 1  . levocetirizine (XYZAL) 5 MG tablet Take 1 tablet (5 mg total) by mouth every evening. (Patient not taking: Reported on 05/30/2019) 30 tablet 1   No current facility-administered medications on file prior to visit.    Past Medical History:  Diagnosis Date  . Hypothyroid      Review of Systems  Respiratory: Negative.   Cardiovascular: Negative.   Gastrointestinal: Negative.   Genitourinary: Negative.   Musculoskeletal: Negative.   Psychiatric/Behavioral: Negative.   All other systems reviewed and are negative.      Objective:   Physical Exam Vitals signs and nursing note reviewed.  Constitutional:      Appearance: She is not ill-appearing.  Neck:     Musculoskeletal: Normal range of motion.     Comments: No thyromegaly Cardiovascular:     Rate and Rhythm: Normal rate and regular rhythm.     Heart sounds: Normal heart sounds. No murmur.  Pulmonary:     Effort: Pulmonary effort is normal. No respiratory distress.     Breath sounds: Normal breath sounds. No wheezing or rhonchi.  Abdominal:     General: Abdomen is flat. Bowel sounds are normal. There is no distension.     Palpations: There is no mass.     Tenderness: There is no abdominal tenderness.  Musculoskeletal:     Right lower leg: No edema.     Left lower leg: No edema.  Neurological:     General: No focal deficit present.     Mental Status: She is alert and oriented to person, place, and time.  Psychiatric:        Mood and Affect: Mood normal.        Behavior: Behavior normal.        Thought Content: Thought content normal.        Judgment: Judgment normal.      Office Visit from 05/30/2019 in [facility]  PHQ-9 Total Score  3          Assessment:     Hypothyroidism Depression Elevated liver enzymes    Plan:     Check problem list.

## 2019-05-30 NOTE — Assessment & Plan Note (Signed)
No acute change. Check TSH today. I will contact her with the result.

## 2019-05-30 NOTE — Assessment & Plan Note (Signed)
Actually does not have depression at this point. May resolve this on her problem list. Monitor routinely with PHQ9 screen.

## 2019-05-30 NOTE — Assessment & Plan Note (Signed)
I reviewed and discussed her most recent lab with her which looks okay with mildly elevated ALT at 35. No GI symptoms. No repeat lab warranted at this time. Monitor closely.

## 2019-05-30 NOTE — Patient Instructions (Signed)
Influenza Virus Vaccine (Flucelvax) What is this medicine? INFLUENZA VIRUS VACCINE (in floo EN zuh VAHY ruhs vak SEEN) helps to reduce the risk of getting influenza also known as the flu. The vaccine only helps protect you against some strains of the flu. This medicine may be used for other purposes; ask your health care provider or pharmacist if you have questions. COMMON BRAND NAME(S): FLUCELVAX What should I tell my health care provider before I take this medicine? They need to know if you have any of these conditions:  bleeding disorder like hemophilia  fever or infection  Guillain-Barre syndrome or other neurological problems  immune system problems  infection with the human immunodeficiency virus (HIV) or AIDS  low blood platelet counts  multiple sclerosis  an unusual or allergic reaction to influenza virus vaccine, other medicines, foods, dyes or preservatives  pregnant or trying to get pregnant  breast-feeding How should I use this medicine? This vaccine is for injection into a muscle. It is given by a health care professional. A copy of Vaccine Information Statements will be given before each vaccination. Read this sheet carefully each time. The sheet may change frequently. Talk to your pediatrician regarding the use of this medicine in children. Special care may be needed. Overdosage: If you think you've taken too much of this medicine contact a poison control center or emergency room at once. Overdosage: If you think you have taken too much of this medicine contact a poison control center or emergency room at once. NOTE: This medicine is only for you. Do not share this medicine with others. What if I miss a dose? This does not apply. What may interact with this medicine?  chemotherapy or radiation therapy  medicines that lower your immune system like etanercept, anakinra, infliximab, and adalimumab  medicines that treat or prevent blood clots like  warfarin  phenytoin  steroid medicines like prednisone or cortisone  theophylline  vaccines This list may not describe all possible interactions. Give your health care provider a list of all the medicines, herbs, non-prescription drugs, or dietary supplements you use. Also tell them if you smoke, drink alcohol, or use illegal drugs. Some items may interact with your medicine. What should I watch for while using this medicine? Report any side effects that do not go away within 3 days to your doctor or health care professional. Call your health care provider if any unusual symptoms occur within 6 weeks of receiving this vaccine. You may still catch the flu, but the illness is not usually as bad. You cannot get the flu from the vaccine. The vaccine will not protect against colds or other illnesses that may cause fever. The vaccine is needed every year. What side effects may I notice from receiving this medicine? Side effects that you should report to your doctor or health care professional as soon as possible:  allergic reactions like skin rash, itching or hives, swelling of the face, lips, or tongue Side effects that usually do not require medical attention (Report these to your doctor or health care professional if they continue or are bothersome.):  fever  headache  muscle aches and pains  pain, tenderness, redness, or swelling at the injection site  tiredness This list may not describe all possible side effects. Call your doctor for medical advice about side effects. You may report side effects to FDA at 1-800-FDA-1088. Where should I keep my medicine? The vaccine will be given by a health care professional in a clinic, pharmacy, doctor's   office, or other health care setting. You will not be given vaccine doses to store at home. NOTE: This sheet is a summary. It may not cover all possible information. If you have questions about this medicine, talk to your doctor, pharmacist, or  health care provider.  2020 Elsevier/Gold Standard (2011-05-24 14:06:47)  

## 2019-05-31 ENCOUNTER — Telehealth: Payer: Self-pay | Admitting: Family Medicine

## 2019-05-31 LAB — TSH: TSH: 0.36 u[IU]/mL — ABNORMAL LOW (ref 0.450–4.500)

## 2019-05-31 MED ORDER — LEVOTHYROXINE SODIUM 112 MCG PO TABS: 112.0000 ug | ORAL_TABLET | ORAL | 1 refills | Status: DC

## 2019-05-31 NOTE — Telephone Encounter (Signed)
HIPAA compliant callback message left.  Whenever she calls back, let her know that her TSH is low. It gradually trended down since her last test in January.  I will reduce her synthroid dose from 125 to 112 mcg/day and recheck TSH in about 8-12 weeks.

## 2019-11-14 ENCOUNTER — Other Ambulatory Visit: Payer: Self-pay | Admitting: Family Medicine

## 2019-11-14 ENCOUNTER — Telehealth: Payer: Self-pay | Admitting: Family Medicine

## 2019-11-14 DIAGNOSIS — E039 Hypothyroidism, unspecified: Secondary | ICD-10-CM

## 2019-11-14 NOTE — Telephone Encounter (Signed)
[employer] interpreter ID# [id number]  I got med refill request for Synthroid.  Patient need follow-up for TSH check.  She confirmed she is compliant with Synthroid 112 mcg QD.  She will like to come to the lab for blood work.  Appointment made for her.

## 2019-11-14 NOTE — Progress Notes (Signed)
TH

## 2019-11-17 ENCOUNTER — Other Ambulatory Visit: Payer: Self-pay

## 2019-11-17 DIAGNOSIS — E039 Hypothyroidism, unspecified: Secondary | ICD-10-CM

## 2019-11-18 ENCOUNTER — Telehealth: Payer: Self-pay | Admitting: Family Medicine

## 2019-11-18 LAB — TSH: TSH: 15.9 u[IU]/mL — ABNORMAL HIGH (ref 0.450–4.500)

## 2019-11-18 MED ORDER — LEVOTHYROXINE SODIUM 125 MCG PO TABS: 125.0000 ug | ORAL_TABLET | Freq: Every day | ORAL | 1 refills | Status: DC

## 2019-11-18 NOTE — Telephone Encounter (Signed)
Test result discussed.   TSH elevated.  Will increase dose back to .  May d/c 112 mcg.  Return in 2 months for TSH.  All her questions were answered.

## 2020-01-13 ENCOUNTER — Encounter: Payer: Self-pay | Admitting: Family Medicine

## 2020-01-13 ENCOUNTER — Ambulatory Visit (INDEPENDENT_AMBULATORY_CARE_PROVIDER_SITE_OTHER): Payer: Self-pay | Admitting: Family Medicine

## 2020-01-13 ENCOUNTER — Other Ambulatory Visit: Payer: Self-pay

## 2020-01-13 VITALS — BP 108/60 | HR 69 | Ht 61.0 in | Wt 163.6 lb

## 2020-01-13 DIAGNOSIS — Z1159 Encounter for screening for other viral diseases: Secondary | ICD-10-CM

## 2020-01-13 DIAGNOSIS — E018 Other iodine-deficiency related thyroid disorders and allied conditions: Secondary | ICD-10-CM

## 2020-01-13 DIAGNOSIS — E039 Hypothyroidism, unspecified: Secondary | ICD-10-CM

## 2020-01-13 NOTE — Patient Instructions (Signed)
Hipotiroidismo Hypothyroidism  El hipotiroidismo ocurre cuando la glndula tiroidea no produce la cantidad suficiente de ciertas hormonas (es hipoactiva). La glndula tiroidea es una pequea glndula ubicada en la parte delantera inferior del cuello, justo delante de la trquea. Esta glndula produce hormonas que ayudan a controlar la forma en la que el cuerpo usa los alimentos para obtener energa (metabolismo) as como tambin la funcin cardaca y la funcin cerebral. Estas hormonas tambin juegan un papel para mantener los huesos fuertes. Cuando la tiroides es hipoactiva, produce muy poca cantidad de las hormonas tiroxina (T4) y triyodotironina (T3). Cules son las causas? Esta afeccin puede ser causada por lo siguiente:  Enfermedad de Hashimoto. Se trata de una enfermedad por la cual el sistema del cuerpo encargado de combatir las enfermedades (sistema inmunitario) ataca la glndula tiroidea. Esta es la causa ms frecuente.  Infecciones virales.  Embarazo.  Ciertos medicamentos.  Defectos congnitos.  Radioterapias anteriores en la cabeza o el cuello para el cncer.  Tratamiento previo con yodo radioactivo.  Exposicin a la radiacin en el ambiente, en el pasado.  Extirpacin quirrgica previa de una parte o de toda la tiroides.  Problemas con una glndula ubicada en el centro del cerebro (hipfisis).  Falta de una cantidad suficiente de yodo en la dieta. Qu incrementa el riesgo? Es ms probable que usted sufra esta afeccin si:  Es mujer.  Tiene antecedentes familiares de afecciones tiroideas.  Usa un medicamento denominado litio.  Toma medicamentos que afectan el sistema inmunitario (inmunosupresores). Cules son los signos o los sntomas? Los sntomas de esta afeccin incluyen los siguientes:  Sensacin de falta de energa (letargo).  Incapacidad para tolerar el fro.  Aumento de peso que no puede explicarse por un cambio en la dieta o en los hbitos de  ejercicio fsico.  Falta de apetito.  Piel seca.  Pelo grueso.  Irregularidades menstruales.  Ralentizacin de los procesos de pensamiento.  Estreimiento.  Tristeza o depresin. Cmo se diagnostica? Esta afeccin se puede diagnosticar en funcin de lo siguiente:  Los sntomas, sus antecedentes mdicos y un examen fsico.  Anlisis de sangre. Tambin puede someterse a estudios por imgenes como una ecografa o una resonancia magntica (RM). Cmo se trata? Esta afeccin se trata con medicamentos que reemplazan las hormonas tiroideas que el cuerpo no produce. Despus de comenzar el tratamiento, pueden pasar varias semanas hasta la desaparicin de los sntomas. Siga estas indicaciones en su casa:  Tome los medicamentos de venta libre y los recetados solamente como se lo haya indicado el mdico.  Si empieza a tomar medicamentos nuevos, infrmele al mdico.  Concurra a todas las visitas de control como se lo haya indicado el mdico. Esto es importante. ? A medida que la afeccin mejora, es posible que haya que modificar las dosis de los medicamentos de hormona tiroidea. ? Tendr que hacerse anlisis de sangre peridicamente, de modo que el mdico pueda controlar la afeccin. Comunquese con un mdico si:  Los sntomas no mejoran con el tratamiento.  Est tomando medicamentos de reemplazo de la tiroides y: ? Suda mucho. ? Tiene temblores. ? Se siente ansioso. ? Baja de peso rpidamente. ? No puede tolerar el calor. ? Tiene cambios emocionales. ? Tiene diarrea. ? Se siente dbil. Solicite ayuda inmediatamente si tiene:  Dolor en el pecho.  Latidos cardacos irregulares.  Latidos cardacos rpidos.  Dificultad para respirar. Resumen  El hipotiroidismo ocurre cuando la glndula tiroidea no produce la cantidad suficiente de ciertas hormonas (es hipoactiva).  Cuando la tiroides   es hipoactiva, produce muy poca cantidad de las hormonas tiroxina (T4) y triyodotironina  (T3).  La causa ms frecuente es la enfermedad de Hashimoto, una enfermedad por la cual el sistema del cuerpo encargado de combatir las enfermedades (sistema inmunitario) ataca la glndula tiroidea. La afeccin tambin puede ser consecuencia de infecciones virales, medicamentos, el embarazo o luego de un tratamiento de radioterapia en la cabeza o el cuello.  Los sntomas pueden incluir aumento de peso, piel seca, estreimiento, sensacin de no tener energa e incapacidad para tolerar el fro.  Esta afeccin se trata con medicamentos que reemplazan las hormonas tiroideas que el cuerpo no produce. Esta informacin no tiene como fin reemplazar el consejo del mdico. Asegrese de hacerle al mdico cualquier pregunta que tenga. Document Revised: 09/22/2017 Document Reviewed: 07/11/2017 Elsevier Patient Education  2020 Elsevier Inc.  

## 2020-01-13 NOTE — Progress Notes (Signed)
    SUBJECTIVE:   CHIEF COMPLAINT / HPI:   Hypothyroidism: Here for follow-up. Since she changed her synthroid dose she had headache and felt weak for the most part of the week. Sometimes she will feel dizzy.  She last felt dizzy in June. She has been feeling well since then.  PERTINENT  PMH / PSH: PMX reviewed.  OBJECTIVE:   BP 108/60   Pulse 69   Ht 5\' 1"  (1.549 m)   Wt 163 lb 9.6 oz (74.2 kg)   LMP 12/22/2019   SpO2 98%   BMI 30.91 kg/m   Physical Exam Vitals and nursing note reviewed.  Neck:     Comments: No thyromegaly Cardiovascular:     Rate and Rhythm: Normal rate and regular rhythm.     Pulses: Normal pulses.     Heart sounds: Normal heart sounds. No murmur heard.   Pulmonary:     Effort: Pulmonary effort is normal. No respiratory distress.     Breath sounds: Normal breath sounds. No wheezing.  Abdominal:     General: Abdomen is flat. Bowel sounds are normal. There is no distension.     Palpations: Abdomen is soft. There is no mass.     Tenderness: There is no abdominal tenderness.  Musculoskeletal:        General: No swelling or tenderness.      ASSESSMENT/PLAN:   HYPOTHYROIDISM, POST-RADIOACTIVE IODINE Compliant with Synthroid 125 mg qd. She is here for f/u. TSH checked. I will call with result once it is available.   Hepatitis C Screening: Counseling provided and she agreed with screening. Hep C screening ordered.  12/24/2019, MD [facility]

## 2020-01-13 NOTE — Assessment & Plan Note (Signed)
Compliant with Synthroid 125 mg qd. She is here for f/u. TSH checked. I will call with result once it is available.

## 2020-01-14 ENCOUNTER — Telehealth: Payer: Self-pay | Admitting: Family Medicine

## 2020-01-14 LAB — HEPATITIS C ANTIBODY: Hep C Virus Ab: <0.1 s/co ratio (ref 0.0–0.9)

## 2020-01-14 LAB — TSH: TSH: 7.13 u[IU]/mL — ABNORMAL HIGH (ref 0.450–4.500)

## 2020-01-14 NOTE — Telephone Encounter (Signed)
[employer] ID #: [id number]  I discussed test result TSH result with her. TSH improved but still above level.  I discussed increasing her dose vs keeping at current level and recheck in about 4-8 weeks. She prefers to keep her current dose and recheck. If no further improvement, then we will increase her dose.  Appointment made for F/U.  We also discussed her [employer] which has expired. Number given to call for renewal.

## 2020-02-17 ENCOUNTER — Other Ambulatory Visit: Payer: Self-pay

## 2020-02-17 ENCOUNTER — Other Ambulatory Visit (HOSPITAL_COMMUNITY)
Admission: RE | Admit: 2020-02-17 | Discharge: 2020-02-17 | Disposition: A | Discharge status: Home / Self Care | Payer: Self-pay | Source: Ambulatory Visit | Attending: Family Medicine | Admitting: Family Medicine

## 2020-02-17 ENCOUNTER — Encounter: Payer: Self-pay | Admitting: Family Medicine

## 2020-02-17 ENCOUNTER — Ambulatory Visit (INDEPENDENT_AMBULATORY_CARE_PROVIDER_SITE_OTHER): Payer: Self-pay | Admitting: Family Medicine

## 2020-02-17 VITALS — BP 100/62 | HR 65 | Wt 162.0 lb

## 2020-02-17 DIAGNOSIS — E039 Hypothyroidism, unspecified: Secondary | ICD-10-CM

## 2020-02-17 DIAGNOSIS — Z309 Encounter for contraceptive management, unspecified: Secondary | ICD-10-CM

## 2020-02-17 DIAGNOSIS — Z0000 Encounter for general adult medical examination without abnormal findings: Secondary | ICD-10-CM

## 2020-02-17 DIAGNOSIS — Z124 Encounter for screening for malignant neoplasm of cervix: Secondary | ICD-10-CM

## 2020-02-17 LAB — POCT URINE PREGNANCY: Preg Test, Ur: NEGATIVE

## 2020-02-17 MED ORDER — NORGESTIM-ETH ESTRAD TRIPHASIC 0.18/0.215/0.25 MG-25 MCG PO TABS: 1.0000 | ORAL_TABLET | Freq: Every day | ORAL | 11 refills | Status: DC

## 2020-02-17 NOTE — Patient Instructions (Signed)
Prueba de Papanicolaou Pap Test Por qu me debo realizar esta prueba? La prueba de Papanicolaou, tambin denominada citologa vaginal, es una prueba de cribado para detectar signos de:  Cncer de la vagina, del cuello uterino y del tero. El cuello uterino es la parte baja del tero que se abre hacia la vagina.  Infeccin.  Cambios que podran ser un signo de que se est desarrollando un cncer (cambios precancerosos). Las mujeres deben realizarse esta prueba con regularidad. En general, debe hacerse una prueba de Papanicolaou cada 3 aos hasta alcanzar la menopausia o hasta los 65 aos. Las mujeres entre 30 y 60 aos de edad pueden elegir realizarse la prueba de Papanicolaou al mismo tiempo que la prueba del VPH (virus del papiloma humano) cada 5 aos (en lugar de cada 3 aos). El mdico puede recomendarle que se realice pruebas de Papanicolaou con ms o menos frecuencia en funcin de sus afecciones mdicas y los resultados de la prueba de Papanicolaou anterior. Qu tipo de muestra se toma?  El mdico recolectar una muestra de clulas de la superficie del cuello uterino. Lo har utilizando un pequeo hisopo de algodn, una esptula de plstico o un cepillo. Esta muestra se recolecta durante un examen plvico, mientras usted est recostada boca arriba sobre la mesa de examen con los pies en los descansos para pies (estribos). En algunos casos, tambin pueden recolectarse fluidos (secreciones) del cuello uterino y la vagina. Cmo debo prepararme para esta prueba?  Tenga en cuenta en qu etapa del ciclo menstrual se encuentra. Es posible que se le pida que vuelva a programar la prueba si est menstruando el da en que debe realizrsela.  Si el da en que debe realizarse la prueba tiene una infeccin vaginal aparente, deber volver a programar la prueba.  Siga las indicaciones del mdico acerca de lo siguiente: ? Cambiar o suspender los medicamentos que toma habitualmente. Algunos  medicamentos pueden provocar resultados anormales de la prueba, como los digitlicos y la tetraciclina. ? Evite las duchas vaginales o los baos de inmersin el da de la prueba o el da anterior. Informe al mdico acerca de lo siguiente:  Cualquier alergia que tenga.  Todos los medicamentos que utiliza, incluidos vitaminas, hierbas, gotas oftlmicas, cremas y medicamentos de venta libre.  Cualquier enfermedad de la sangre que tenga.  Cirugas a las que se someti.  Cualquier afeccin mdica que tenga.  Si est embarazada o podra estarlo. Cmo se informan los resultados? Los resultados de la prueba se informarn como anormales o normales. Puede producirse un resultado positivo falso. Este tipo de resultado es incorrecto porque indica que una enfermedad est presente cuando en realidad no lo est. Puede producirse un resultado negativo falso. Este tipo de resultado es incorrecto porque indica que una enfermedad no est presente cuando en realidad lo est. Qu significan los resultados? Un resultado normal en la prueba significa que no tiene signos de cncer de la vagina, del cuello uterino o del tero. Un resultado anormal puede significar que tiene:  Cncer. Una prueba de Papanicolaou por s sola no es suficiente para diagnosticar cncer. En este caso, se le realizarn ms pruebas.  Cambios precancerosos en la vagina, cuello uterino o tero.  Inflamacin del cuello uterino.  Enfermedades de transmisin sexual (ETS).  Infecciones por hongos.  Infecciones por parsitos. Hable con su mdico sobre lo que significan sus resultados. Preguntas para hacerle al mdico Consulte a su mdico o pregunte en el departamento donde se realiza la prueba acerca de lo siguiente:    Cundo estarn disponibles mis resultados?  Cmo obtendr mis resultados?  Cules son mis opciones de tratamiento?  Qu otras pruebas necesito?  Cules son los prximos pasos que debo seguir? Resumen  En  general, las mujeres deben hacerse una prueba de Papanicolaou cada 3 aos hasta alcanzar la menopausia o hasta los 65 aos de edad.  El mdico recolectar una muestra de clulas de la superficie del cuello uterino. Lo har utilizando un pequeo hisopo de algodn, una esptula de plstico o un cepillo.  En algunos casos, tambin pueden recolectarse fluidos (secreciones) del cuello uterino y la vagina. Esta informacin no tiene como fin reemplazar el consejo del mdico. Asegrese de hacerle al mdico cualquier pregunta que tenga. Document Revised: 05/22/2017 Document Reviewed: 05/22/2017 Elsevier Patient Education  2020 [employer]  

## 2020-02-17 NOTE — Progress Notes (Signed)
Subjective:     Sandra [name] is a 39 y.o. female and is here for a comprehensive physical exam. The patient reports no problems. Not on Birth control. LMP was 1 week ago. She uses natural birth control. She has 3 boys, 19. 16 and 7 year.  Social History   Socioeconomic History  . Marital status: Married    Spouse name: Not on file  . Number of children: Not on file  . Years of education: Not on file  . Highest education level: Not on file  Occupational History  . Not on file  Tobacco Use  . Smoking status: Never Smoker  . Smokeless tobacco: Never Used  Substance and Sexual Activity  . Alcohol use: No  . Drug use: No  . Sexual activity: Yes  Other Topics Concern  . Not on file  Social History Narrative  . Not on file   Social Determinants of Health   Financial Resource Strain:   . Difficulty of Paying Living Expenses: Not on file  Food Insecurity:   . Worried About Programme researcher, broadcasting/film/video in the Last Year: Not on file  . Ran Out of Food in the Last Year: Not on file  Transportation Needs:   . Lack of Transportation (Medical): Not on file  . Lack of Transportation (Non-Medical): Not on file  Physical Activity:   . Days of Exercise per Week: Not on file  . Minutes of Exercise per Session: Not on file  Stress:   . Feeling of Stress : Not on file  Social Connections:   . Frequency of Communication with Friends and Family: Not on file  . Frequency of Social Gatherings with Friends and Family: Not on file  . Attends Religious Services: Not on file  . Active Member of Clubs or Organizations: Not on file  . Attends Banker Meetings: Not on file  . Marital Status: Not on file  Intimate Partner Violence:   . Fear of Current or Ex-Partner: Not on file  . Emotionally Abused: Not on file  . Physically Abused: Not on file  . Sexually Abused: Not on file   Health Maintenance  Topic Date Due  . INFLUENZA VACCINE  01/25/2020  . PAP SMEAR-Modifier  06/06/2020   . TETANUS/TDAP  10/19/2020  . COVID-19 Vaccine  Completed  . Hepatitis C Screening  Completed  . HIV Screening  Completed    The following portions of the patient's history were reviewed and updated as appropriate: allergies, current medications, past family history, past medical history, past social history, past surgical history and problem list.  Review of Systems Pertinent items noted in HPI and remainder of comprehensive ROS otherwise negative.   Objective:    BP 100/62   Pulse 65   Wt 162 lb (73.5 kg)   SpO2 98%   BMI 30.61 kg/m  General appearance: alert and cooperative Head: Normocephalic, without obvious abnormality, atraumatic Eyes: conjunctivae/corneas clear. PERRL, EOM's intact. Fundi benign. Mild left exophthalmos.  Ears: normal TM's and external ear canals both ears Throat: lips, mucosa, and tongue normal; teeth and gums normal Lungs: clear to auscultation bilaterally Heart: regular rate and rhythm, S1, S2 normal, no murmur, click, rub or gallop Abdomen: soft, non-tender; bowel sounds normal; no masses,  no organomegaly Pelvic: cervix normal in appearance, external genitalia normal, no adnexal masses or tenderness, no cervical motion tenderness, rectovaginal septum normal, uterus normal size, shape, and consistency and vagina normal without discharge Extremities: extremities normal, atraumatic, no cyanosis or  edema Skin: Skin color, texture, turgor normal. No rashes or lesions Lymph nodes: Cervical, supraclavicular, and axillary nodes normal. Neurologic: Alert and oriented X 3, normal strength and tone. Normal symmetric reflexes. Normal coordination and gait    Assessment:    Healthy female exam.      Plan:    PAP completed. She will return for flu shot. TSH checked today for hypothyroidism. She is compliant with her current dose of Synthroid 125 mcg qd. Birth control counseling done. Upreg done today was negative. She opted for OCP. Med escribed.  See  After Visit Summary for Counseling Recommendations

## 2020-02-18 ENCOUNTER — Telehealth: Payer: Self-pay | Admitting: Family Medicine

## 2020-02-18 LAB — CYTOLOGY - PAP
Comment: NEGATIVE
Diagnosis: NEGATIVE
High risk HPV: NEGATIVE

## 2020-02-18 LAB — TSH: TSH: 8.48 u[IU]/mL — ABNORMAL HIGH (ref 0.450–4.500)

## 2020-02-18 MED ORDER — LEVOTHYROXINE SODIUM 137 MCG PO TABS: 137.0000 ug | ORAL_TABLET | Freq: Every day | ORAL | 1 refills | Status: DC

## 2020-02-18 NOTE — Telephone Encounter (Signed)
I discussed her TSH result with her. Still elevated. Plan to go up on her Synthroid dose from 125 mcg to 137 mcg and recheck in about 8 weeks.   All questions were answered.

## 2020-06-08 ENCOUNTER — Telehealth: Payer: Self-pay | Admitting: Family Medicine

## 2020-06-08 ENCOUNTER — Other Ambulatory Visit: Payer: Self-pay | Admitting: Family Medicine

## 2020-06-08 DIAGNOSIS — E039 Hypothyroidism, unspecified: Secondary | ICD-10-CM

## 2020-06-08 NOTE — Telephone Encounter (Signed)
Need to schedule flu shot. She stated that she does not get flu shot -Declined.  She requested f/u for her TSH which is past due. Lab appointment scheduled for her.

## 2020-06-14 ENCOUNTER — Other Ambulatory Visit: Payer: Self-pay

## 2020-06-14 DIAGNOSIS — E039 Hypothyroidism, unspecified: Secondary | ICD-10-CM

## 2020-06-15 ENCOUNTER — Telehealth: Payer: Self-pay | Admitting: Family Medicine

## 2020-06-15 LAB — TSH: TSH: 0.645 u[IU]/mL (ref 0.450–4.500)

## 2020-06-15 NOTE — Telephone Encounter (Signed)
HIPAA compliant callback message left.   Please advise patient that her TSH level is normal. Continue current dose of synthroid and recheck lab in 6-12 months.

## 2020-09-22 ENCOUNTER — Other Ambulatory Visit: Payer: Self-pay | Admitting: Family Medicine

## 2020-11-10 ENCOUNTER — Other Ambulatory Visit: Payer: Self-pay

## 2020-11-10 ENCOUNTER — Encounter: Payer: Self-pay | Admitting: Family Medicine

## 2020-11-10 ENCOUNTER — Ambulatory Visit (INDEPENDENT_AMBULATORY_CARE_PROVIDER_SITE_OTHER): Payer: Self-pay | Admitting: Family Medicine

## 2020-11-10 VITALS — BP 136/80 | HR 60 | Wt 158.8 lb

## 2020-11-10 DIAGNOSIS — R1900 Intra-abdominal and pelvic swelling, mass and lump, unspecified site: Secondary | ICD-10-CM | POA: Insufficient documentation

## 2020-11-10 DIAGNOSIS — R3129 Other microscopic hematuria: Secondary | ICD-10-CM

## 2020-11-10 DIAGNOSIS — N393 Stress incontinence (female) (male): Secondary | ICD-10-CM | POA: Insufficient documentation

## 2020-11-10 LAB — POCT URINALYSIS DIP (MANUAL ENTRY)
Bilirubin, UA: NEGATIVE
Glucose, UA: NEGATIVE mg/dL
Ketones, POC UA: NEGATIVE mg/dL
Nitrite, UA: NEGATIVE
Protein Ur, POC: NEGATIVE mg/dL
Spec Grav, UA: 1.01 (ref 1.010–1.025)
Urobilinogen, UA: 0.2 E.U./dL
pH, UA: 6.5 (ref 5.0–8.0)

## 2020-11-10 LAB — POCT URINE PREGNANCY: Preg Test, Ur: NEGATIVE

## 2020-11-10 NOTE — Progress Notes (Signed)
    SUBJECTIVE:   CHIEF COMPLAINT / HPI:   Pelvic Bloating  Had menstrual cycle two weeks ago and has continued to have bloating and weird feeling since. She started OCPs two months ago. She had one full pack, but then stopped after her last period because she noted that her bleeding was a little more heavier than usual.  She is worried because this is not normal for her. Does not know her exact LMP.  Sexually active.  Typically periods last 3-5 days. No menorrhagia.  Regular q monthly.  Patient also notes urinary incontinence with coughing, sneezing and with exercise.  She is a G4, P3 with 1 vaginal delivery and 2 cesarean sections.  She has some concern that she might have uterine prolapse as she has read about this given her feelings of pelvic fullness and urinary incontinence. She otherwise has not had any fevers, chills, dysuria.  No abnormal uterine bleeding.  No changes in bowel movements, nausea, vomiting.  PERTINENT  PMH / PSH: Hypothyroidism  OBJECTIVE:   BP 136/80   Pulse 60   Wt 158 lb 12.8 oz (72 kg)   SpO2 97%   BMI 30.00 kg/m   General: Well-appearing female, no acute distress Abdomen: Nontender nondistended.  Positive bowel sounds.  No tenderness to palpation of pelvis. GU: Normal-appearing external genitalia.  With bearing down, no obvious prolapse of bladder or other pelvic structures.  ASSESSMENT/PLAN:   Female stress incontinence  Her stress incontinence is likely due to previous pregnancies.  She does not have any evidence of prolapse on exam. Offered physical therapy referral, however, patient reports due to uninsured status, she would like to do exercises at home.  Provided patient with information about pelvic exercises.   Also obtained UA and urine pregnancy to rule out urinary tract infection.  UA was negative for urinary tract infection but did have microscopic RBCs.  Among patient's multiple concerns today, she did note possible hematuria, though is not sure  if this was true hematuria versus menstrual bleeding.  Given UA findings, have recommended that patient follow-up in 1 month for repeat UA and follow-up of her pelvic symptoms.  Pelvic fullness Physical exam is negative today for any concerning findings.  Urine pregnancy performed and this was negative.  Patient is concerned that this was due to her recent birth control.  She is not sure she will continue to take it.  Counseled patient on expected changes and menstrual cycles with starting hormonal contraception.  I have asked patient to make a symptom diary given multiple concerns today but no concerning physical exam findings.  She is agreeable for this plan.  Microscopic hematuria Repeat UA in 1 month.   Melene Plan, MD [facility]

## 2020-11-10 NOTE — Patient Instructions (Signed)
We are doing urine testing today to see if these are causing your symptoms.  I gave you some information about pelvic exercises.  Please follow up if you continue to have issues.  If you continue to have pelvic bloating, please make a symptom diary to bring to your next appointment.

## 2020-11-10 NOTE — Assessment & Plan Note (Signed)
Her stress incontinence is likely due to previous pregnancies.  She does not have any evidence of prolapse on exam. Offered physical therapy referral, however, patient reports due to uninsured status, she would like to do exercises at home.  Provided patient with information about pelvic exercises.   Also obtained UA and urine pregnancy to rule out urinary tract infection.  UA was negative for urinary tract infection but did have microscopic RBCs.  Among patient's multiple concerns today, she did note possible hematuria, though is not sure if this was true hematuria versus menstrual bleeding.  Given UA findings, have recommended that patient follow-up in 1 month for repeat UA and follow-up of her pelvic symptoms.

## 2020-11-10 NOTE — Assessment & Plan Note (Signed)
Physical exam is negative today for any concerning findings.  Urine pregnancy performed and this was negative.  Patient is concerned that this was due to her recent birth control.  She is not sure she will continue to take it.  Counseled patient on expected changes and menstrual cycles with starting hormonal contraception.  I have asked patient to make a symptom diary given multiple concerns today but no concerning physical exam findings.  She is agreeable for this plan.

## 2020-11-10 NOTE — Assessment & Plan Note (Signed)
Repeat UA in 1 month.

## 2021-03-09 ENCOUNTER — Other Ambulatory Visit: Payer: Self-pay | Admitting: Family Medicine

## 2021-05-28 ENCOUNTER — Encounter (HOSPITAL_BASED_OUTPATIENT_CLINIC_OR_DEPARTMENT_OTHER): Payer: Self-pay

## 2021-05-28 ENCOUNTER — Emergency Department (HOSPITAL_BASED_OUTPATIENT_CLINIC_OR_DEPARTMENT_OTHER)
Admission: EM | Admit: 2021-05-28 | Discharge: 2021-05-28 | Disposition: A | Discharge status: Home / Self Care | Payer: No Typology Code available for payment source | Attending: Emergency Medicine | Admitting: Emergency Medicine

## 2021-05-28 ENCOUNTER — Emergency Department (HOSPITAL_BASED_OUTPATIENT_CLINIC_OR_DEPARTMENT_OTHER): Payer: No Typology Code available for payment source

## 2021-05-28 ENCOUNTER — Other Ambulatory Visit: Payer: Self-pay

## 2021-05-28 DIAGNOSIS — E039 Hypothyroidism, unspecified: Secondary | ICD-10-CM | POA: Diagnosis not present

## 2021-05-28 DIAGNOSIS — S4992XA Unspecified injury of left shoulder and upper arm, initial encounter: Secondary | ICD-10-CM | POA: Diagnosis present

## 2021-05-28 DIAGNOSIS — Z79899 Other long term (current) drug therapy: Secondary | ICD-10-CM | POA: Diagnosis not present

## 2021-05-28 DIAGNOSIS — Y92410 Unspecified street and highway as the place of occurrence of the external cause: Secondary | ICD-10-CM | POA: Insufficient documentation

## 2021-05-28 DIAGNOSIS — S46812A Strain of other muscles, fascia and tendons at shoulder and upper arm level, left arm, initial encounter: Secondary | ICD-10-CM | POA: Insufficient documentation

## 2021-05-28 MED ORDER — METHOCARBAMOL 500 MG PO TABS: 500.0000 mg | ORAL_TABLET | Freq: Two times a day (BID) | ORAL | 0 refills | Status: DC
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
## 2021-05-28 NOTE — ED Notes (Signed)
Pt discharged to home. Discharge instructions have been discussed with patient and/or family members. Pt verbally acknowledges understanding d/c instructions, and endorses comprehension to checkout at registration before leaving.  °

## 2021-05-28 NOTE — Discharge Instructions (Signed)
Your x-ray was negative for any fracture. Your symptoms are consistent with a muscular injury.    Your examination today is most concerning for a muscular injury 1. Medications: alternate ibuprofen and tylenol for pain control, take all usual home medications as they are prescribed 2. Treatment: rest, ice, elevate and use an ACE wrap or other compressive therapy to decrease swelling. Also drink plenty of fluids and do plenty of gentle stretching and move the affected muscle through its normal range of motion to prevent stiffness. 3. Follow Up: If your symptoms do not improve please follow up with orthopedics/sports medicine or your PCP for discussion of your diagnoses and further evaluation after today's visit; if you do not have a primary care doctor use the resource guide provided to find one; Please return to the ER for worsening symptoms or other concerns.
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
## 2021-06-14 ENCOUNTER — Encounter: Payer: Self-pay | Admitting: Family Medicine

## 2021-06-14 ENCOUNTER — Ambulatory Visit (INDEPENDENT_AMBULATORY_CARE_PROVIDER_SITE_OTHER): Payer: Self-pay | Admitting: Family Medicine

## 2021-06-14 ENCOUNTER — Other Ambulatory Visit: Payer: Self-pay

## 2021-06-14 VITALS — BP 109/78 | HR 69 | Ht 61.0 in | Wt 162.0 lb

## 2021-06-14 DIAGNOSIS — R7401 Elevation of levels of liver transaminase levels: Secondary | ICD-10-CM

## 2021-06-14 DIAGNOSIS — M549 Dorsalgia, unspecified: Secondary | ICD-10-CM

## 2021-06-14 DIAGNOSIS — F418 Other specified anxiety disorders: Secondary | ICD-10-CM

## 2021-06-14 DIAGNOSIS — E039 Hypothyroidism, unspecified: Secondary | ICD-10-CM

## 2021-06-14 DIAGNOSIS — E018 Other iodine-deficiency related thyroid disorders and allied conditions: Secondary | ICD-10-CM

## 2021-06-14 DIAGNOSIS — R319 Hematuria, unspecified: Secondary | ICD-10-CM

## 2021-06-14 DIAGNOSIS — R3129 Other microscopic hematuria: Secondary | ICD-10-CM

## 2021-06-14 LAB — POCT URINALYSIS DIP (MANUAL ENTRY)
Bilirubin, UA: NEGATIVE
Blood, UA: NEGATIVE
Glucose, UA: NEGATIVE mg/dL
Ketones, POC UA: NEGATIVE mg/dL
Nitrite, UA: NEGATIVE
Protein Ur, POC: NEGATIVE mg/dL
Spec Grav, UA: 1.01 (ref 1.010–1.025)
Urobilinogen, UA: 0.2 E.U./dL
pH, UA: 6.5 (ref 5.0–8.0)

## 2021-06-14 LAB — POCT UA - MICROSCOPIC ONLY

## 2021-06-14 MED ORDER — FLUCONAZOLE 150 MG PO TABS: 150.0000 mg | ORAL_TABLET | Freq: Once | ORAL | 0 refills | Status: AC

## 2021-06-14 NOTE — Assessment & Plan Note (Signed)
Asymptomatic. UA repeated today is negative for RBC, but positive for Leukocytes. Microscopy showed epithelial cells and yeast. No Urinary symptoms, hence, less likely concern about UTI. I called her after visit and discussed the microscopic UA with + yeast. She denies vaginal discharge. I offered monitoring vs treat with Diflucan. She opted for the Latter. Diflucan escribed. F/U as needed.

## 2021-06-14 NOTE — Assessment & Plan Note (Signed)
Cmet checked. I will contact her soon with her test results.
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
## 2021-06-14 NOTE — Patient Instructions (Signed)
Control de la [employer] adultos Managing Anxiety, Adult Despus de haber recibido un diagnstico de ansiedad, es posible que se sienta aliviado al saber por qu se haba sentido o haba actuado de cierto modo. Es posible que tambin se sienta abrumado por el tratamiento que tiene por delante y por lo que este significar para su vida. Con atencin y apoyo, usted puede controlar esta afeccin. Cmo manejar los cambios en el estilo de vida Control del estrs y la ansiedad El estrs es la reaccin del cuerpo ante los cambios y los acontecimientos de la vida, tanto buenos [place]. La [employer]ley[employer]son de los episodios de estrs duran slo algunas horas, pero el estrs puede ser continuo y Science writer a ms que solo estrs. Aunque el estrs puede desempear un papel importante en la ansiedad, no es lo mismo que la ansiedad. El estrs generalmente es causado por algo externo, como una fecha lmite, una prueba o una competencia. El estrs normalmente pasa despus de que el evento desencadenante ha terminado.  La ansiedad es causada por algo interno, por ejemplo, imaginar un resultado terrible o preocuparse porque algo ir mal y lo devastar. A menudo, la ansiedad no desaparece incluso despus de que el evento desencadenante ha finalizado y puede tornarse en una preocupacin a largo plazo (crnica). Es importante comprender las diferencias entre el estrs y la ansiedad, y Chief Operating Officer el estrs de manera efectiva para que no genere una respuesta de ansiedad. Hable con el mdico o un consejero para obtener ms informacin sobre cmo reducir la ansiedad y Development worker, community. Es posible que el profesional sugiera tcnicas para reducir la tensin, tales como: Musicoterapia. Pase tiempo creando o escuchando msica que disfrute y lo inspire. Meditacin consciente. Practique el estar consciente de la respiracin normal sin intentar controlarla. Puede realizarse mientras est sentado o camina. Oracin centrante. Esto implica centrarse en  una palabra, frase o imagen sagrada que le signifique algo y le genere paz. Respiracin profunda. Para hacer esto, expanda el estmago e inhale lentamente por la nariz. Mantenga el aire durante unos 3 a 5 segundos. Luego, exhale lentamente mientras deja que los msculos del estmago se relajen. Dilogo interno. Aprenda a notar e identificar patrones de pensamiento que conducen a reacciones de ansiedad y cambie esos patrones a pensamientos que se transmitan tranquilidad. Relajacin muscular. Dedique tiempo a tensar los msculos y [employer]. Elija una tcnica para reducir la tensin que se adapte a su estilo de vida y su personalidad. Estas tcnicas llevan tiempo y prctica. Resrvese de 5 a 15 minutos por da para Associate Professor. Algunos terapeutas pueden ofrecer orientacin y capacitacin en estas tcnicas. Es posible que algunos planes de seguros mdicos cubran la capacitacin. Otras cosas que puede hacer para controlar el estrs y la ansiedad incluyen: Midwife un registro del estrs. Esto puede ayudarlo a identificar qu le desencadena su reaccin y, Engineer, mining, aprender las maneras de controlar su respuesta al [place]. Pensar en cmo reacciona ante ciertas situaciones. Es posible que no sea capaz de Chief Operating Officer todo, pero puede Corporate investment banker. Hacerse tiempo para las actividades que lo ayudan a Lexicographer y no sentir culpa por pasar su tiempo de [place]. Crear imgenes visuales. Esto implica imaginar o crear imgenes mentales para ayudarlo a relajarse. Practicar yoga. A travs del yoga, puede disminuir la tensin y Armed forces training and education officer.  Medicamentos Los medicamentos pueden ayudar a [employer]. Algunos medicamentos para la ansiedad: Antidepresivos. Por lo general, se recetan para el control diario a Air cabin crew. Medicamentos para  la ansiedad. Estos se pueden agregar en casos graves, especialmente cuando ocurren crisis de [place]. Un mdico recetar los medicamentos. Cuando se usan  juntos, los medicamentos, la psicoterapia y las tcnicas de reduccin de la tensin pueden ser el tratamiento ms efectivo. Las [employer] Corporation relaciones interpersonales pueden ser muy importantes para ayudar a su recuperacin. Intente pasar ms tiempo interactuando con amigos y familiares de [place]. Considere la posibilidad de ir a terapia de pareja, si tiene una pareja, tomar clases de educacin familiar o ir a Information systems manager. La terapia puede ayudarlos a usted y a los dems a comprender mejor su afeccin. Cmo Facilities manager en su ansiedad Cada persona responde de [place] diferente al tratamiento de la ansiedad. Se dice que est recuperado de la ansiedad cuando los sntomas disminuyen y dejan de Producer, television/film/video en las actividades diarias en el hogar o [place]. Esto podra significar que comenzar a Radio producer lo siguiente: Dealer y atencin. Tener menos interferencia de la preocupacin en el pensamiento diario. Dormir mejor. Estar menos irritable. Tener ms energa. Tener [employer]. Tambin es Public librarian cuando su afeccin empeora. Comunquese con el mdico si sus sntomas interfieren en su hogar o su trabajo, y usted siente que su afeccin no est mejorando. Siga estas instrucciones en su casa: Actividad Actividad fsica. Los adultos deben hacer lo siguiente: Education officer, environmental, al menos, 150 minutos de actividad fsica por semana. El ejercicio debe aumentar la frecuencia cardaca y Media planner transpirar (ejercicio de intensidad moderada). Realizar ejercicios de fortalecimiento por lo [employer] por semana. Dormir bien y por el tiempo adecuado. La Harley[employer]Davidson de los adultos necesitan entre 7 y 9 horas de sueo todas las noches. Estilo de vida  Siga una dieta saludable que incluya abundantes frutas, verduras, cereales integrales, productos lcteos descremados y protenas magras. No consuma muchos alimentos con alto contenido de grasas, azcares agregados o sal  (sodio). Opte por cosas que le simplifiquen la vida. No consuma ningn producto que contenga nicotina o tabaco. Estos productos incluyen cigarrillos, tabaco para Theatre manager y aparatos de vapeo, como los Administrator, Civil Service. Si necesita ayuda para dejar de fumar, consulte al mdico. Evite el consumo de cafena, alcohol y ciertos medicamentos contra el resfro de venta sin receta. Estos podran Optician, dispensing. Pregntele al farmacutico qu medicamentos no debera tomar. Instrucciones generales Use los medicamentos de venta libre y los recetados solamente como se lo haya indicado el mdico. Concurra a todas las visitas de seguimiento. Esto es importante. Dnde buscar apoyo Puede conseguir ayuda y [employer] siguientes lugares: Grupos de [place]. Organizaciones comunitarias y en lnea. Un lder espiritual de confianza. Terapia de pareja. Clases de educacin familiar. Terapia familiar. Dnde obtener ms informacin Formar parte de un grupo de apoyo podra resultarle til para enfrentar la ansiedad. Las siguientes fuentes pueden ayudarlo a Medical laboratory scientific officer consejeros o grupos de apoyo cerca de su hogar: Mental Health America (Salud Mental de los [place] Unidos): www.mentalhealthamerica.net Anxiety and Depression Association of [place] [ADAA] (Asociacin de Ansiedad y Depresin de los Estados Unidos): ProgramCam.de The First American on Mental Illness [NAMI] (Alianza Nacional Sobre Enfermedades Mentales): www.nami.org Comunquese con un mdico si: Le resulta difcil permanecer concentrado o finalizar las tareas diarias. Pasa muchas horas por da sintindose preocupado por la vida cotidiana. La preocupacin le provoca un cansancio extremo. Comienza a tener dolores de [place] o se siente tenso con frecuencia. Tiene nuseas crnicas y/o diarrea. Solicite ayuda de inmediato si: Se le acelera la frecuencia cardaca y Games developer. Piensa acerca de lastimarse  a usted misma o a Metallurgist. Si alguna vez siente que puede lastimarse o Physicist, medical a Economist, o tiene pensamientos de poner fin a su vida, busque ayuda de inmediato. Dirjase al servicio de urgencias ms cercano o: Comunquese con el servicio de emergencias de su localidad (911 en los [place]idos). Llame a una lnea de asistencia al suicida y atencin en crisis como National Suicide Prevention Lifeline (Lnea Nacional de Prevencin del Suicidio) al ([phone])657-4303. Est disponible las 24 horas del da en los [place]. Enve un mensaje de texto a la lnea para casos de crisis al [phone] (en los [place] UU.). Resumen Tomar medidas para aprender y usar tcnicas de reduccin de la tensin pueden ayudarlo a calmarse y a Chiropractor reaccin de ansiedad. Cuando se usan juntos, los medicamentos, la psicoterapia y las tcnicas de reduccin de la tensin pueden ser el tratamiento ms efectivo. [employer], los amigos y las parejas pueden desempear un papel importante para brindarle apoyo. Esta informacin no tiene Theme park manager el consejo del mdico. Asegrese de hacerle al mdico cualquier pregunta que tenga. Document Revised: 10/21/2020 Document Reviewed: 10/21/2020 Elsevier Patient Education  2022 [employer].

## 2021-06-14 NOTE — Assessment & Plan Note (Signed)
TSH checked today. I will refill meds based on her test result.

## 2021-06-15 ENCOUNTER — Telehealth: Payer: Self-pay | Admitting: Family Medicine

## 2021-06-15 LAB — CMP14+EGFR
ALT: 28 IU/L (ref 0–32)
AST: 20 IU/L (ref 0–40)
Albumin/Globulin Ratio: 2 (ref 1.2–2.2)
Albumin: 4.7 g/dL (ref 3.8–4.8)
Alkaline Phosphatase: 91 IU/L (ref 44–121)
BUN/Creatinine Ratio: 15 (ref 9–23)
BUN: 13 mg/dL (ref 6–24)
Bilirubin Total: 0.6 mg/dL (ref 0.0–1.2)
CO2: 23 mmol/L (ref 20–29)
Calcium: 9 mg/dL (ref 8.7–10.2)
Chloride: 103 mmol/L (ref 96–106)
Creatinine, Ser: 0.88 mg/dL (ref 0.57–1.00)
Globulin, Total: 2.4 g/dL (ref 1.5–4.5)
Glucose: 84 mg/dL (ref 70–99)
Potassium: 4.2 mmol/L (ref 3.5–5.2)
Sodium: 139 mmol/L (ref 134–144)
Total Protein: 7.1 g/dL (ref 6.0–8.5)
eGFR: 85 mL/min/{1.73_m2} (ref >59)

## 2021-06-15 LAB — TSH: TSH: 1.58 u[IU]/mL (ref 0.450–4.500)

## 2021-06-15 MED ORDER — LEVOTHYROXINE SODIUM 137 MCG PO TABS: ORAL_TABLET | ORAL | 1 refills | Status: DC

## 2021-06-15 NOTE — Telephone Encounter (Signed)
Normal lab report discussed.  Synthroid refilled.

## 2021-06-17 ENCOUNTER — Other Ambulatory Visit: Payer: Self-pay

## 2021-06-17 MED ORDER — LEVOTHYROXINE SODIUM 137 MCG PO TABS: ORAL_TABLET | ORAL | 1 refills | Status: DC

## 2021-06-29 NOTE — Telephone Encounter (Signed)
Spoke with pharmacy and [facility] is the name of the Health visitor. Pharmacy has refills on file for the patient, however need verbal ok to dispense to patient.   Please advise.

## 2021-06-29 NOTE — Telephone Encounter (Signed)
Approval given to pharmacy for manufacture change.

## 2021-06-29 NOTE — Telephone Encounter (Signed)
Received phone call from pharmacist. Pharmacy is switching manufacturers and needs verbal permission to change to [facility].   Please advise if change is appropriate.   Veronda Prude, RN

## 2022-03-01 ENCOUNTER — Other Ambulatory Visit: Payer: Self-pay | Admitting: Family Medicine

## 2022-04-21 ENCOUNTER — Encounter: Payer: Self-pay | Admitting: Family Medicine

## 2022-04-21 ENCOUNTER — Ambulatory Visit (INDEPENDENT_AMBULATORY_CARE_PROVIDER_SITE_OTHER): Payer: Self-pay | Admitting: Family Medicine

## 2022-04-21 VITALS — BP 112/76 | HR 64 | Ht 61.0 in | Wt 162.1 lb

## 2022-04-21 DIAGNOSIS — E039 Hypothyroidism, unspecified: Secondary | ICD-10-CM

## 2022-04-21 DIAGNOSIS — M542 Cervicalgia: Secondary | ICD-10-CM | POA: Insufficient documentation

## 2022-04-21 DIAGNOSIS — E018 Other iodine-deficiency related thyroid disorders and allied conditions: Secondary | ICD-10-CM

## 2022-04-21 NOTE — Patient Instructions (Addendum)
Vacuna antigripal (inactivada o recombinante): lo que debe saber Influenza (Flu) Vaccine (Inactivated or Recombinant): What You Need to Know 1. Por qu vacunarse? La vacuna antigripal puede prevenir la gripe. La gripe es una enfermedad contagiosa que se disemina en los [place] cada ao, por lo general, entre octubre y mayo. Cualquier persona puede contraer gripe, pero es ms peligrosa para ciertas personas. Los bebs y los nios pequeos, los mayores de 65 aos y las personas embarazadas, as como las personas que tienen ciertas enfermedades o cuyo sistema inmunitario est debilitado, corren un riesgo mayor de tener complicaciones debido a la gripe. La neumona, la bronquitis, las infecciones de los senos paranasales y las infecciones de odos son ejemplos de complicaciones relacionadas con la gripe. Si tiene una afeccin, por ejemplo, enfermedad cardaca, cncer o diabetes, la gripe puede empeorarla. La gripe puede causar fiebre y escalofros, dolor de garganta, dolores musculares, fatiga, tos, dolor de cabeza y secrecin o congestin nasal. Algunas personas pueden tener vmitos y diarrea, aunque esto es ms frecuente en los nios que en los adultos. En un ao promedio, miles de personas mueren en los [place] debido a la gripe, y muchas ms deben ser hospitalizadas. Cada ao, la vacuna antigripal previene millones de enfermedades y evita visitas al mdico relacionadas con la gripe. 2. Vacunas antigripales Los CDC (Centros para el Control y la Prevencin de Enfermedades) recomiendan que todas las personas a partir de los 6 meses de edad se vacunen cada temporada de gripe. Es posible que los nios de 6 meses a 8 aos deban recibir 2 dosis durante la misma temporada de gripe. Todas las dems personas tienen que aplicarse 1 sola dosis cada temporada de gripe. La vacuna comienza a surtir efecto aproximadamente 2 semanas despus de su aplicacin. Hay muchos virus de la gripe, y estos mutan  permanentemente. Cada ao, se elabora una nueva vacuna antigripal para brindar proteccin contra los virus gripales que se cree probablemente causen la enfermedad en la siguiente temporada de gripe. Incluso si la vacuna no es especfica para esos virus, aun as puede brindar cierta proteccin. La vacuna antigripal no causa gripe. La vacuna antigripal puede administrarse al mismo tiempo que otras vacunas. 3. Hable con el mdico Comunquese con la persona que le coloca las vacunas si la persona que la recibe: Ha tenido una reaccin alrgica despus de una dosis previa de la vacuna antigripal o tiene alguna alergia grave, potencialmente mortal Alguna vez tuvo sndrome de Guillain-Barr (tambin llamado "SGB") En algunos casos, es posible que el mdico decida posponer la aplicacin de la vacuna antigripal hasta una visita en el futuro. La vacuna antigripal puede administrarse en cualquier momento durante el embarazo. Las personas que estn o vayan a estar embarazadas durante la temporada de gripe deben recibir la vacuna antigripal inactivada. Las personas que sufren trastornos menores, como un resfro, pueden vacunarse. Las personas que tienen enfermedades moderadas o graves generalmente deben esperar hasta recuperarse para poder recibir la vacuna antigripal. Su mdico puede darle ms informacin. 4. Riesgos de una reaccin a la vacuna Despus de recibir la vacunacin antigripal, puede haber dolor, enrojecimiento e hinchazn en el lugar de la inyeccin, fiebre, dolores musculares y dolor de cabeza. Puede haber un pequeo aumento del riesgo de sufrir sndrome de Guillain-Barr (SGB) despus de la aplicacin de la vacuna antigripal inactivada. Los nios pequeos que reciben la vacuna antigripal junto con la vacuna antineumoccica (PCV13) o la DTaP en el mismo momento pueden tener una probabilidad un poco ms elevada   de tener una convulsin debido a la fiebre. Informe al mdico si un nio que est recibiendo  la vacuna antigripal ha tenido una convulsin alguna vez. Las personas a veces se desmayan despus de procedimientos mdicos, incluida la vacunacin. Informe al mdico si se siente mareado, tiene cambios en la visin o zumbidos en los odos. Al igual que con cualquier [employer], existe una probabilidad muy remota de que una vacuna cause una reaccin alrgica grave, otra lesin grave o la muerte. 5. Qu pasa si se presenta un problema grave? Podra producirse una reaccin alrgica despus de que la persona vacunada abandone la clnica. Si observa signos de Nurse, mental health grave (ronchas, hinchazn de la cara y la garganta, dificultad para respirar, latidos cardacos acelerados, mareos o debilidad), llame al 9-1-1 y lleve a la persona al hospital ms cercano. Si se presentan otros signos que le preocupan, comunquese con su mdico. Las reacciones adversas deben informarse al Sistema de Informe de Eventos Adversos de Clinical biochemist (VAERS). Por lo general, el mdico presenta este informe o puede hacerlo usted mismo. Visite el sitio web del VAERS en www.vaers.SamedayNews.es o llame al 713 525 2724. El VAERS es solo para Electrical engineer, y los miembros de su personal no proporcionan asesoramiento mdico. 6. [place] de Compensacin de Daos por [place] de Compensacin de Daos por Clinical biochemist ([employer], Runner, broadcasting/film/video) es un programa federal que fue creado para Patent examiner a las personas que puedan haber sufrido daos al recibir ciertas vacunas. Las Artist a presuntas lesiones o muerte debidas a la vacunacin tienen un lmite de tiempo para su presentacin, que puede ser de tan solo [employer]. Visite el sitio web del VICP en GoldCloset.com.ee o llame al 1-(678)648-9829 para obtener ms informacin acerca del programa y de cmo presentar un reclamo. 7. Cmo puedo obtener ms informacin? Pregntele a su mdico. Comunquese con el  servicio de salud de su localidad o su estado. Visite el sitio web de Environmental manager) (Administracin de Alimentos y Chief Strategy Officer) para ver los prospectos de las vacunas e informacin adicional en TraderRating.uy. Comunquese con Garment/textile technologist for [employer]) (Centros para el Control y la Prevencin de [place]): Llame al [phone] (1-800-CDC-INFO) o Visite el sitio web de los CDC en https://gibson.com/. Fuente: Declaracin de informacin de los CDC sobre la vacuna antigripal inactivada (01/30/2020) Este mismo material est disponible en http://www.wolf.info/ sin cargo. Esta informacin no tiene Marine scientist el consejo del mdico. Asegrese de hacerle al mdico cualquier pregunta que tenga. Document Revised: 05/31/2021 Document Reviewed: 03/03/2021 Elsevier Patient Education  [place].

## 2022-04-21 NOTE — Assessment & Plan Note (Signed)
TSH checked. FLP and Bmet offered. She declined due to lack of insurance coverage.

## 2022-04-21 NOTE — Assessment & Plan Note (Signed)
Etiology unclear May be dental vs MSK. Unlikely related to her thyroid disorder, given no thyromegaly. I will contact her with TSH report. Tylenol as needed for pain. ENT referral in the future if there is no improvement. She agreed with the plan.

## 2022-04-21 NOTE — Progress Notes (Signed)
    SUBJECTIVE:   CHIEF COMPLAINT / HPI:   Hypothyroidism: She is here for follow-up. She is compliant with her Synthroid 137 mcg QD. No weight change concerns.   Neck pain: C/O neck pain, close to her jaw on both sides x 6 months or more. She denies pain with swallowing. Pain is about 2/10 in severity only when pressure is applied. No neck swelling or other upper respiratory tract symptoms.   PERTINENT  PMH / PSH: PMHx reviewed  OBJECTIVE:   BP 112/76   Pulse 64   Ht 5\' 1"  (1.549 m)   Wt 162 lb 2 oz (73.5 kg)   LMP 04/07/2022   SpO2 100%   BMI 30.63 kg/m   Physical Exam Vitals and nursing note reviewed.  HENT:     Ears:     Comments: Mild cerumen impaction B/L    Mouth/Throat:     Pharynx: Oropharynx is clear. No posterior oropharyngeal erythema.  Eyes:     General:        Right eye: No discharge.        Left eye: No discharge.     Extraocular Movements: Extraocular movements intact.     Pupils: Pupils are equal, round, and reactive to light.  Neck:     Thyroid: No thyroid mass, thyromegaly or thyroid tenderness.     Comments: No jaw tenderness Cardiovascular:     Rate and Rhythm: Normal rate and regular rhythm.     Heart sounds: Normal heart sounds.  Pulmonary:     Effort: Pulmonary effort is normal. No respiratory distress.     Breath sounds: Normal breath sounds. No wheezing.  Musculoskeletal:     Cervical back: Normal range of motion.  Lymphadenopathy:     Cervical: No cervical adenopathy.      ASSESSMENT/PLAN:   HYPOTHYROIDISM, POST-RADIOACTIVE IODINE TSH checked. FLP and Bmet offered. She declined due to lack of insurance coverage.  Neck pain Etiology unclear May be dental vs MSK. Unlikely related to her thyroid disorder, given no thyromegaly. I will contact her with TSH report. Tylenol as needed for pain. ENT referral in the future if there is no improvement. She agreed with the plan.     Flu shot and COVID-19 shot discussed. She does not  take this vaccines and she politely declined both. She declined Tdap as well. She can call her pharmacy if she decides on getting vaccinated. She agreed with the plan.   Andrena Mews, MD [place]

## 2022-04-23 LAB — TSH: TSH: 1.79 u[IU]/mL (ref 0.450–4.500)

## 2022-04-24 ENCOUNTER — Telehealth: Payer: Self-pay | Admitting: Family Medicine

## 2022-04-24 NOTE — Telephone Encounter (Signed)
TSH test result discussed with her. She has no additional questions. She is good on her Synthroid refill.

## 2022-07-14 ENCOUNTER — Ambulatory Visit (INDEPENDENT_AMBULATORY_CARE_PROVIDER_SITE_OTHER): Payer: Self-pay | Admitting: Family Medicine

## 2022-07-14 ENCOUNTER — Encounter: Payer: Self-pay | Admitting: Family Medicine

## 2022-07-14 VITALS — BP 110/83 | HR 67 | Ht 61.0 in | Wt 164.8 lb

## 2022-07-14 DIAGNOSIS — R42 Dizziness and giddiness: Secondary | ICD-10-CM | POA: Insufficient documentation

## 2022-07-14 DIAGNOSIS — E039 Hypothyroidism, unspecified: Secondary | ICD-10-CM

## 2022-07-14 DIAGNOSIS — R7309 Other abnormal glucose: Secondary | ICD-10-CM

## 2022-07-14 DIAGNOSIS — E018 Other iodine-deficiency related thyroid disorders and allied conditions: Secondary | ICD-10-CM

## 2022-07-14 NOTE — Progress Notes (Signed)
    SUBJECTIVE:   CHIEF COMPLAINT / HPI:   Blood sugar check: The patient is here to get checked for DM per her dentist's recommendation. She was informed that her frequent gum inflammation may be due to diabetes. FHx of DM in her mother.  Dizziness:  C/O intermittent dizziness, mostly around her period and worsened with poor food intake. Her period lasts 3-4 days with normal flow. No other concerns. She worries about her thyroid/hormone imbalance/DM as a cause of her dizziness.    PERTINENT  PMH / PSH: PMHx reviewed  OBJECTIVE:   BP 110/83   Pulse 67   Ht 5\' 1"  (1.549 m)   Wt 164 lb 12.8 oz (74.8 kg)   LMP 06/30/2022   SpO2 100%   BMI 31.14 kg/m   Physical Exam Vitals and nursing note reviewed.  Constitutional:      Appearance: She is not ill-appearing.  HENT:     Mouth/Throat:     Comments: Well appearing gum Neck:     Comments: No thyromegaly Cardiovascular:     Rate and Rhythm: Normal rate and regular rhythm.     Pulses: Normal pulses.     Heart sounds: Normal heart sounds. No murmur heard. Pulmonary:     Effort: Pulmonary effort is normal. No respiratory distress.     Breath sounds: Normal breath sounds. No wheezing.  Musculoskeletal:     Cervical back: Neck supple.      ASSESSMENT/PLAN:   HYPOTHYROIDISM, POST-RADIOACTIVE IODINE TSH rechecked. Lipid profile checked too.  Dizziness Likely related to blood loss during menstruation. Checked CBC, CMet, A1C. I will contact her with results.     Andrena Mews, MD [place]

## 2022-07-14 NOTE — Assessment & Plan Note (Addendum)
Likely related to blood loss during menstruation. Checked CBC, CMet, A1C. I will contact her with results.

## 2022-07-14 NOTE — Patient Instructions (Signed)
Dizziness Dizziness is a common problem. It makes you feel unsteady or light-headed. You may feel like you are about to pass out (faint). Dizziness can lead to getting hurt if you stumble or fall. Dizziness can be caused by many things, including: Medicines. Not having enough water in your body (dehydration). Illness. Follow these instructions at home: Eating and drinking  Drink enough fluid to keep your pee (urine) pale yellow. This helps to keep you from getting dehydrated. Try to drink more clear fluids, such as water. Do not drink alcohol. Limit how much caffeine you drink or eat, if your doctor tells you to do that. Limit how much salt (sodium) you drink or eat, if your doctor tells you to do that. Activity  Avoid making quick movements. Stand up slowly from sitting in a chair, and steady yourself until you feel okay. In the morning, first sit up on the side of the bed. When you feel okay, stand up slowly while you hold onto something. Do this until you know that your balance is okay. If you need to stand in one place for a long time, move your legs often. Tighten and relax the muscles in your legs while you are standing. Do not drive or use machinery if you feel dizzy. Avoid bending down if you feel dizzy. Place items in your home so you can reach them easily without leaning over. Lifestyle Do not smoke or use any products that contain nicotine or tobacco. If you need help quitting, ask your doctor. Try to lower your stress level. You can do this by using methods such as yoga or meditation. Talk with your doctor if you need help. General instructions Watch your dizziness for any changes. Take over-the-counter and prescription medicines only as told by your doctor. Talk with your doctor if you think that you are dizzy because of a medicine that you are taking. Tell a friend or a family member that you are feeling dizzy. If he or she notices any changes in your behavior, have this  person call your doctor. Keep all follow-up visits. Contact a doctor if: Your dizziness does not go away. Your dizziness or light-headedness gets worse. You feel like you may vomit (are nauseous). You have trouble hearing. You have new symptoms. You are unsteady on your feet. You feel like the room is spinning. You have neck pain or a stiff neck. You have a fever. Get help right away if: You vomit or have watery poop (diarrhea), and you cannot eat or drink anything. You have trouble: Talking. Walking. Swallowing. Using your arms, hands, or legs. You feel generally weak. You are not thinking clearly, or you have trouble forming sentences. A friend or family member may notice this. You have: Chest pain. Pain in your belly (abdomen). Shortness of breath. Sweating. Your vision changes. You are bleeding. You have a very bad headache. These symptoms may be an emergency. Get help right away. Call your local emergency services (911 in the [place] Do not wait to see if the symptoms will go away. Do not drive yourself to the hospital. Summary Dizziness makes you feel unsteady or light-headed. You may feel like you are about to pass out (faint). Drink enough fluid to keep your pee (urine) pale yellow. Do not drink alcohol. Avoid making quick movements if you feel dizzy. Watch your dizziness for any changes. This information is not intended to replace advice given to you by your health care provider. Make sure you discuss any questions   you have with your health care provider. Document Revised: 05/17/2020 Document Reviewed: 05/17/2020 Elsevier Patient Education  2023 Elsevier Inc.  

## 2022-07-14 NOTE — Assessment & Plan Note (Addendum)
TSH rechecked. Lipid profile checked too.

## 2022-07-15 LAB — CMP14+EGFR
ALT: 21 IU/L (ref 0–32)
AST: 16 IU/L (ref 0–40)
Albumin/Globulin Ratio: 2 (ref 1.2–2.2)
Albumin: 4.9 g/dL (ref 3.9–4.9)
Alkaline Phosphatase: 88 IU/L (ref 44–121)
BUN/Creatinine Ratio: 16 (ref 9–23)
BUN: 13 mg/dL (ref 6–24)
Bilirubin Total: 0.6 mg/dL (ref 0.0–1.2)
CO2: 21 mmol/L (ref 20–29)
Calcium: 9.4 mg/dL (ref 8.7–10.2)
Chloride: 103 mmol/L (ref 96–106)
Creatinine, Ser: 0.8 mg/dL (ref 0.57–1.00)
Globulin, Total: 2.4 g/dL (ref 1.5–4.5)
Glucose: 81 mg/dL (ref 70–99)
Potassium: 3.9 mmol/L (ref 3.5–5.2)
Sodium: 138 mmol/L (ref 134–144)
Total Protein: 7.3 g/dL (ref 6.0–8.5)
eGFR: 94 mL/min/{1.73_m2} (ref >59)

## 2022-07-15 LAB — CBC
Hematocrit: 40.5 % (ref 34.0–46.6)
Hemoglobin: 13.9 g/dL (ref 11.1–15.9)
MCH: 32.1 pg (ref 26.6–33.0)
MCHC: 34.3 g/dL (ref 31.5–35.7)
MCV: 94 fL (ref 79–97)
Platelets: 240 10*3/uL (ref 150–450)
RBC: 4.33 x10E6/uL (ref 3.77–5.28)
RDW: 12.6 % (ref 11.7–15.4)
WBC: 5.5 10*3/uL (ref 3.4–10.8)

## 2022-07-15 LAB — HEMOGLOBIN A1C
Est. average glucose Bld gHb Est-mCnc: 97 mg/dL
Hgb A1c MFr Bld: 5 % (ref 4.8–5.6)

## 2022-07-15 LAB — LIPID PANEL
Chol/HDL Ratio: 3 ratio (ref 0.0–4.4)
Cholesterol, Total: 138 mg/dL (ref 100–199)
HDL: 46 mg/dL (ref >39)
LDL Chol Calc (NIH): 77 mg/dL (ref 0–99)
Triglycerides: 73 mg/dL (ref 0–149)
VLDL Cholesterol Cal: 15 mg/dL (ref 5–40)

## 2022-07-15 LAB — TSH: TSH: 1.32 u[IU]/mL (ref 0.450–4.500)

## 2022-07-17 ENCOUNTER — Telehealth: Payer: Self-pay | Admitting: Family Medicine

## 2022-07-17 NOTE — Telephone Encounter (Signed)
Normal test results discussed. She was appreciative of the call.

## 2022-08-26 ENCOUNTER — Other Ambulatory Visit: Payer: Self-pay | Admitting: Family Medicine

## 2023-02-16 ENCOUNTER — Other Ambulatory Visit: Payer: Self-pay | Admitting: Family Medicine

## 2023-07-03 ENCOUNTER — Encounter: Payer: Self-pay | Admitting: Family Medicine

## 2023-07-03 ENCOUNTER — Ambulatory Visit (INDEPENDENT_AMBULATORY_CARE_PROVIDER_SITE_OTHER): Payer: Self-pay | Admitting: Family Medicine

## 2023-07-03 VITALS — BP 110/70 | HR 63 | Ht 61.0 in | Wt 171.2 lb

## 2023-07-03 DIAGNOSIS — M545 Low back pain, unspecified: Secondary | ICD-10-CM

## 2023-07-03 DIAGNOSIS — H0520 Unspecified exophthalmos: Secondary | ICD-10-CM

## 2023-07-03 DIAGNOSIS — E018 Other iodine-deficiency related thyroid disorders and allied conditions: Secondary | ICD-10-CM

## 2023-07-03 DIAGNOSIS — E039 Hypothyroidism, unspecified: Secondary | ICD-10-CM

## 2023-07-03 DIAGNOSIS — M549 Dorsalgia, unspecified: Secondary | ICD-10-CM | POA: Insufficient documentation

## 2023-07-03 DIAGNOSIS — Z13228 Encounter for screening for other metabolic disorders: Secondary | ICD-10-CM

## 2023-07-03 MED ORDER — IBUPROFEN 400 MG PO TABS: 400.0000 mg | ORAL_TABLET | Freq: Three times a day (TID) | ORAL | 1 refills | Status: DC | PRN

## 2023-07-03 MED ORDER — TETANUS-DIPHTH-ACELL PERTUSSIS 5-2.5-18.5 LF-MCG/0.5 IM SUSP: 0.5000 mL | Freq: Once | INTRAMUSCULAR | 0 refills | Status: AC

## 2023-07-03 NOTE — Patient Instructions (Signed)
 Back Exercises These exercises help to make your trunk and back strong. They also help to keep the lower back flexible. Doing these exercises can help to prevent or lessen pain in your lower back. If you have back pain, try to do these exercises 2-3 times each day or as told by your doctor. As you get better, do the exercises once each day. Repeat the exercises more often as told by your doctor. To stop back pain from coming back, do the exercises once each day, or as told by your doctor. Do exercises exactly as told by your doctor. Stop right away if you feel sudden pain or your pain gets worse. Exercises Single knee to chest Do these steps 3-5 times in a row for each leg: Lie on your back on a firm bed or the floor with your legs stretched out. Bring one knee to your chest. Grab your knee or thigh with both hands and hold it in place. Pull on your knee until you feel a gentle stretch in your lower back or butt. Keep doing the stretch for 10-30 seconds. Slowly let go of your leg and straighten it. Pelvic tilt Do these steps 5-10 times in a row: Lie on your back on a firm bed or the floor with your legs stretched out. Bend your knees so they point up to the ceiling. Your feet should be flat on the floor. Tighten your lower belly (abdomen) muscles to press your lower back against the floor. This will make your tailbone point up to the ceiling instead of pointing down to your feet or the floor. Stay in this position for 5-10 seconds while you gently tighten your muscles and breathe evenly. Cat-cow Do these steps until your lower back bends more easily: Get on your hands and knees on a firm bed or the floor. Keep your hands under your shoulders, and keep your knees under your hips. You may put padding under your knees. Let your head hang down toward your chest. Tighten (contract) the muscles in your belly. Point your tailbone toward the floor so your lower back becomes rounded like the back of a  cat. Stay in this position for 5 seconds. Slowly lift your head. Let the muscles of your belly relax. Point your tailbone up toward the ceiling so your back forms a sagging arch like the back of a cow. Stay in this position for 5 seconds.  Press-ups Do these steps 5-10 times in a row: Lie on your belly (face-down) on a firm bed or the floor. Place your hands near your head, about shoulder-width apart. While you keep your back relaxed and keep your hips on the floor, slowly straighten your arms to raise the top half of your body and lift your shoulders. Do not use your back muscles. You may change where you place your hands to make yourself more comfortable. Stay in this position for 5 seconds. Keep your back relaxed. Slowly return to lying flat on the floor.  Bridges Do these steps 10 times in a row: Lie on your back on a firm bed or the floor. Bend your knees so they point up to the ceiling. Your feet should be flat on the floor. Your arms should be flat at your sides, next to your body. Tighten your butt muscles and lift your butt off the floor until your waist is almost as high as your knees. If you do not feel the muscles working in your butt and the back of  your thighs, slide your feet 1-2 inches (2.5-5 cm) farther away from your butt. Stay in this position for 3-5 seconds. Slowly lower your butt to the floor, and let your butt muscles relax. If this exercise is too easy, try doing it with your arms crossed over your chest. Belly crunches Do these steps 5-10 times in a row: Lie on your back on a firm bed or the floor with your legs stretched out. Bend your knees so they point up to the ceiling. Your feet should be flat on the floor. Cross your arms over your chest. Tip your chin a little bit toward your chest, but do not bend your neck. Tighten your belly muscles and slowly raise your chest just enough to lift your shoulder blades a tiny bit off the floor. Avoid raising your body  higher than that because it can put too much stress on your lower back. Slowly lower your chest and your head to the floor. Back lifts Do these steps 5-10 times in a row: Lie on your belly (face-down) with your arms at your sides, and rest your forehead on the floor. Tighten the muscles in your legs and your butt. Slowly lift your chest off the floor while you keep your hips on the floor. Keep the back of your head in line with the curve in your back. Look at the floor while you do this. Stay in this position for 3-5 seconds. Slowly lower your chest and your face to the floor. Contact a doctor if: Your back pain gets a lot worse when you do an exercise. Your back pain does not get better within 2 hours after you exercise. If you have any of these problems, stop doing the exercises. Do not do them again unless your doctor says it is okay. Get help right away if: You have sudden, very bad back pain. If this happens, stop doing the exercises. Do not do them again unless your doctor says it is okay. This information is not intended to replace advice given to you by your health care provider. Make sure you discuss any questions you have with your health care provider. Document Revised: 08/25/2020 Document Reviewed: 08/25/2020 Elsevier Patient Education  2024 [employer].

## 2023-07-03 NOTE — Progress Notes (Signed)
    SUBJECTIVE:   CHIEF COMPLAINT / HPI: Interpreter ID#: [id number]  Hypothyroidism: She is compliant with Synthroid  137 mcg every day. Here for follow up  Back pain: C/O low back pain x 6 months. Pain radiates to her legs. Sometimes her pain is worsened when she is having her menstrual circle. She denies trauma to her back or recent fall. 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 It becomes difficult to stand straight after vacuuming. She is currently asymptomatic.   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 She denies any eye pain, no FB sensation and no vision loss. She was established previously with ophthalmology and would like to f/u with them.  PERTINENT  PMH / PSH: PMHx reviewed  OBJECTIVE:   BP 110/70   Pulse 63   Ht 5' 1 (1.549 m)   Wt 171 lb 4 oz (77.7 kg)   LMP 06/30/2023   SpO2 99%   BMI 32.36 kg/m   Physical Exam Vitals and nursing note reviewed.  Eyes:     General:        Right eye: No discharge.        Left eye: No discharge.     Extraocular Movements: Extraocular movements intact.     Conjunctiva/sclera: Conjunctivae normal.     Pupils: Pupils are equal, round, and reactive to light.     Comments: Exophthalmos - more on the left.  Neck:     Comments: No thyromegaly Cardiovascular:     Rate and Rhythm: Normal rate and regular rhythm.     Heart sounds: Normal heart sounds. No murmur heard. Pulmonary:     Effort: Pulmonary effort is normal. No respiratory distress.     Breath sounds: Normal breath sounds.  Musculoskeletal:     Lumbar back: Normal. No swelling or deformity.  Neurological:     General: No focal deficit present.     Cranial Nerves: Cranial nerves 2-12 are intact.     Sensory: Sensation is intact.     Motor: Motor function is intact.     Coordination: Coordination is intact.     Gait: Gait is intact.      ASSESSMENT/PLAN:   HYPOTHYROIDISM,  POST-RADIOACTIVE IODINE She is compliant with her med. TSH, Bmet and FLP checked today.  I will contact her soon with her test result  Back pain ?? 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 Information provided on getting Tdap at the pharmacy. She can also get flu and COVID shots at the pharmacy. She agreed with the plan.  Otto Fairly, MD [facility]

## 2023-07-03 NOTE — Assessment & Plan Note (Signed)
 No acute findings on exam She could not tell/spell the medication she was talking about I gave a an after visit call and she pull her resource and spelt - Teppezza As discussed with her, this is an IV medication that can indeed help with exophthalmos However, this is not a medication I prescribe She might need to be seen by endocrinologist for this However, we both decided seeing an ophthalmologist first would be beneficial Referral placed

## 2023-07-03 NOTE — Assessment & Plan Note (Signed)
 She is compliant with her med. TSH, Bmet and FLP checked today.  I will contact her soon with her test result

## 2023-07-03 NOTE — Assessment & Plan Note (Signed)
??   DJD vs overuse MSK She is currently asymptomatic Ibuprofen prn pain escribed

## 2023-07-04 ENCOUNTER — Telehealth: Payer: Self-pay | Admitting: Family Medicine

## 2023-07-04 LAB — LIPID PANEL
Chol/HDL Ratio: 3.7 {ratio} (ref 0.0–4.4)
Cholesterol, Total: 152 mg/dL (ref 100–199)
HDL: 41 mg/dL (ref >39)
LDL Chol Calc (NIH): 88 mg/dL (ref 0–99)
Triglycerides: 131 mg/dL (ref 0–149)
VLDL Cholesterol Cal: 23 mg/dL (ref 5–40)

## 2023-07-04 LAB — BASIC METABOLIC PANEL
BUN/Creatinine Ratio: 16 (ref 9–23)
BUN: 12 mg/dL (ref 6–24)
CO2: 23 mmol/L (ref 20–29)
Calcium: 9.1 mg/dL (ref 8.7–10.2)
Chloride: 103 mmol/L (ref 96–106)
Creatinine, Ser: 0.76 mg/dL (ref 0.57–1.00)
Glucose: 80 mg/dL (ref 70–99)
Potassium: 4 mmol/L (ref 3.5–5.2)
Sodium: 140 mmol/L (ref 134–144)
eGFR: 100 mL/min/{1.73_m2} (ref >59)

## 2023-07-04 LAB — TSH: TSH: 2.4 u[IU]/mL (ref 0.450–4.500)

## 2023-07-04 NOTE — Telephone Encounter (Signed)
 [place] interpreter ID#: [id number]  Normal lab result was discussed with her. Continue Synthroid dose as it is. She has no additional questions.

## 2023-08-20 ENCOUNTER — Other Ambulatory Visit: Payer: Self-pay | Admitting: Family Medicine

## 2023-08-20 ENCOUNTER — Other Ambulatory Visit: Payer: Self-pay | Admitting: Obstetrics and Gynecology

## 2023-08-20 DIAGNOSIS — Z1231 Encounter for screening mammogram for malignant neoplasm of breast: Secondary | ICD-10-CM

## 2023-10-05 ENCOUNTER — Telehealth: Payer: Self-pay

## 2023-10-05 NOTE — Telephone Encounter (Signed)
 Telephoned patient at mobile number using interpreter#[id number]. Left a voice message with BCCCP contact information.

## 2023-10-08 ENCOUNTER — Other Ambulatory Visit: Payer: Self-pay | Admitting: Obstetrics and Gynecology

## 2023-10-08 DIAGNOSIS — Z1231 Encounter for screening mammogram for malignant neoplasm of breast: Secondary | ICD-10-CM

## 2023-12-27 ENCOUNTER — Ambulatory Visit
Admission: RE | Admit: 2023-12-27 | Discharge: 2023-12-27 | Disposition: A | Discharge status: Home / Self Care | Payer: Self-pay | Source: Ambulatory Visit | Attending: Obstetrics and Gynecology | Admitting: Obstetrics and Gynecology

## 2023-12-27 ENCOUNTER — Ambulatory Visit: Payer: Self-pay | Admitting: *Deleted

## 2023-12-27 VITALS — BP 103/67 | Wt 171.0 lb

## 2023-12-27 DIAGNOSIS — Z1239 Encounter for other screening for malignant neoplasm of breast: Secondary | ICD-10-CM

## 2023-12-27 DIAGNOSIS — Z1231 Encounter for screening mammogram for malignant neoplasm of breast: Secondary | ICD-10-CM

## 2023-12-27 NOTE — Progress Notes (Signed)
 [name]  [name] is a 43 y.o. female who presents to [facility] clinic today with no complaints.    Pap Smear: Pap smear not completed today. Last Pap smear was 02/17/2020 at [facility] clinic and was normal with negative HPV. Per patient has no history of an abnormal Pap smear. Last Pap smear result is available in Epic.   Physical exam: Breasts Right breast slightly larger than left breast that per patient is normal for her. No skin abnormalities bilateral breasts. No nipple retraction bilateral breasts. No nipple discharge bilateral breasts. No lymphadenopathy. No lumps palpated bilateral breasts. No complaints of pain or tenderness on exam.      Pelvic/Bimanual Pap is not indicated today per BCCCP guidelines.   Smoking History: Patient has never smoked.  Patient Navigation: Patient education provided. Access to services provided for patient through [place] program. Spanish interpreter [name] from [facility] provided.    Breast and Cervical Cancer Risk Assessment: Patient does not have family history of breast cancer, known genetic mutations, or radiation treatment to the chest before age 52. Patient does not have history of cervical dysplasia, immunocompromised, or DES exposure in-utero.  Risk Scores as of Encounter on 12/27/2023     Sandra [name]           5-year 0.57%   Lifetime 9.15%            Last calculated by Silas, Ansyi K, CMA on 12/27/2023 at  2:10 PM        A: BCCCP exam without pap smear No complaints.  P: Referred patient to the [facility] for a screening mammogram on mobile unit. Appointment scheduled Thursday, December 27, 2023 at 1430.  Driscilla Wanda SQUIBB, RN 12/27/2023 2:21 PM

## 2023-12-27 NOTE — Patient Instructions (Signed)
 Explained breast self awareness with Nya  Prill. Patient did not need a Pap smear today due to last Pap smear and HPV typing was 02/17/2020. Let her know BCCCP will cover Pap smears and HPV typing every 5 years unless has a history of abnormal Pap smears. Referred patient to the [facility] for a screening mammogram on mobile unit. Appointment scheduled Thursday, December 27, 2023 at 1430. Patient aware of appointment and will be there. Let patient know the Breast Center will follow up with her within the next couple weeks with results of her mammogram by letter or phone. Mallori  Seckman verbalized understanding.  Costas Sena, Wanda Ship, RN 2:21 PM

## 2024-01-02 ENCOUNTER — Ambulatory Visit: Payer: Self-pay | Admitting: Family Medicine

## 2024-01-02 NOTE — Telephone Encounter (Signed)
 HIPAA compliant callback message left.  Note: Normal mammogram - repeat in 1 year.  Need appointment for vaccine update. Please, schedule f/u with me soon.

## 2024-02-14 ENCOUNTER — Other Ambulatory Visit: Payer: Self-pay | Admitting: Family Medicine

## 2024-03-26 ENCOUNTER — Telehealth: Payer: Self-pay | Admitting: Family Medicine

## 2024-03-26 NOTE — Telephone Encounter (Signed)
 Patient confirmed identity. She declined flu shot. Does not take the vaccine anymore. She will contact the office for future follow up appointment. I advised if she changes her mind about flu shot, she can get this at the nearby pharmacy. She was appreciative of the call.

## 2024-07-22 ENCOUNTER — Ambulatory Visit: Admitting: Family Medicine

## 2024-07-25 ENCOUNTER — Ambulatory Visit: Admitting: Family Medicine

## 2024-07-25 ENCOUNTER — Encounter: Payer: Self-pay | Admitting: Family Medicine

## 2024-07-25 VITALS — BP 118/83 | HR 66 | Ht 61.0 in | Wt 176.6 lb

## 2024-07-25 DIAGNOSIS — E018 Other iodine-deficiency related thyroid disorders and allied conditions: Secondary | ICD-10-CM

## 2024-07-25 DIAGNOSIS — N943 Premenstrual tension syndrome: Secondary | ICD-10-CM | POA: Insufficient documentation

## 2024-07-25 DIAGNOSIS — R42 Dizziness and giddiness: Secondary | ICD-10-CM

## 2024-07-25 DIAGNOSIS — Z789 Other specified health status: Secondary | ICD-10-CM

## 2024-07-25 DIAGNOSIS — E039 Hypothyroidism, unspecified: Secondary | ICD-10-CM

## 2024-07-25 DIAGNOSIS — M545 Low back pain, unspecified: Secondary | ICD-10-CM

## 2024-07-25 DIAGNOSIS — R5383 Other fatigue: Secondary | ICD-10-CM

## 2024-07-25 DIAGNOSIS — Z0000 Encounter for general adult medical examination without abnormal findings: Secondary | ICD-10-CM

## 2024-07-25 MED ORDER — IBUPROFEN 400 MG PO TABS: 400.0000 mg | ORAL_TABLET | Freq: Three times a day (TID) | ORAL | 1 refills | Status: AC | PRN

## 2024-07-25 MED ORDER — TETANUS-DIPHTH-ACELL PERTUSSIS 5-2.5-18.5 LF-MCG/0.5 IM SUSP: 0.5000 mL | Freq: Once | INTRAMUSCULAR | 0 refills | Status: AC

## 2024-07-25 NOTE — Progress Notes (Signed)
" ° ° °  SUBJECTIVE:   CHIEF COMPLAINT / HPI:   Discussed the use of AI scribe software for clinical note transcription with the patient, who gave verbal consent to proceed.  History of Present Illness   Sandra  [name] is a 44 year old female with hypothyroidism who presents for a thyroid  check-up and concerns about worsening premenstrual symptoms.  She experiences significant premenstrual symptoms starting two weeks before her period, including anxiety, emotional lability, lower back pain, and foot pain. These symptoms have worsened over time. Her menstrual cycle is regular, occurring every month, lasting three to four days, with variable flow. Her last menstrual period was on July 05, 2024. No current depression, but she feels more emotional and depressed around her menstrual cycle. She has a PHQ-9 score of five.  She occasionally feels dizzy. Dizziness occurs more frequently during her menstrual period, with two severe episodes of vertigo-like dizziness in the past year. No current dizziness, vision changes, or leg swelling.  She reports lower back pain, particularly in the mornings, which has been present for about a year. The pain is more pronounced when she is about to menstruate and sometimes affects the right hip.  She has not received a flu or COVID-19 vaccine recently and is unsure about her hepatitis B vaccination status. She is due for a tetanus shot. She does not have medical insurance currently. She does not want covid or flu shot but wishes to update her tetanus shot.       PERTINENT  PMH / PSH: PMHx reviewed  OBJECTIVE:   BP 118/83   Pulse 66   Ht 5' 1 (1.549 m)   Wt 176 lb 9.6 oz (80.1 kg)   LMP 07/05/2024   SpO2 96%   BMI 33.37 kg/m   Physical Exam   GEN: Well-appearing. No Distress HEENT: Left eye bulging. NECK: No thyromegaly, no neck swelling. CHEST: Lungs clear to auscultation bilaterally, no wheezing. CARDIOVASCULAR: Heart regular rate and rhythm, no  murmurs. ABDOMEN: Abdomen non-tender. EXTREMITIES: No leg swelling. MSK: Good gait       ASSESSMENT/PLAN:   Assessment & Plan Premenstrual syndrome Symptoms of emotional lability, anxiety, and lower back pain two weeks prior to menstruation, possibly related to perimenopause. PHQ-9 score of 5 indicates mild depression, likely related to menstrual cycle. - Ordered blood tests including thyroid  function tests. - Provided information on premenstrual syndrome. - Discussed SSRI/OCP initiation - she is not interested in pharmacotherapy at this point - Monitor symptoms and consider medication if symptoms worsen. - Pregnancy risk is low Dizziness Check anemia panel Low back pain, unspecified back pain laterality, unspecified chronicity, unspecified whether sciatica present Chronic lower back pain, primarily in the morning, possibly related to arthritis. - Prescribed ibuprofen . - Will consider x-ray of the back if pain persists during August visit. HYPOTHYROIDISM, POST-RADIOACTIVE IODINE Managed with levothyroxine  137 mcg.  - Ordered thyroid  function tests. Healthcare maintenance Due for Pap smear in August. No recent COVID or flu vaccinations. Tetanus vaccination status unclear, but she is willing to receive it. Hepatitis B vaccination status unclear. - Schedule Pap smear for August. - E-Prescribed tetanus vaccine to her pharmacy. - Ordered blood tests to check hepatitis B vaccination status.     Otto Fairly, MD [facility]  "

## 2024-07-25 NOTE — Assessment & Plan Note (Addendum)
 Symptoms of emotional lability, anxiety, and lower back pain two weeks prior to menstruation, possibly related to perimenopause. PHQ-9 score of 5 indicates mild depression, likely related to menstrual cycle. - Ordered blood tests including thyroid  function tests. - Provided information on premenstrual syndrome. - Discussed SSRI/OCP initiation - she is not interested in pharmacotherapy at this point - Monitor symptoms and consider medication if symptoms worsen. - Pregnancy risk is low

## 2024-07-25 NOTE — Assessment & Plan Note (Addendum)
 Chronic lower back pain, primarily in the morning, possibly related to arthritis. - Prescribed ibuprofen . - Will consider x-ray of the back if pain persists during August visit.

## 2024-07-25 NOTE — Patient Instructions (Signed)
 Sndrome premenstrual (SPM): lo que debe saber Premenstrual Syndrome (PMS): What to Know El sndrome premenstrual (SPM) es una serie de sntomas que pueden audiological scientist a las mujeres como parte de su ciclo menstrual. El SPM suele producirse muchos das antes del inicio del perodo menstrual y desaparece unos das despus de que comience el sangrado. Los sntomas del SPM pueden oscilar entre leves y [place] graves. Cules son las causas? Se desconoce el motivo exacto, pero es posible que est relacionado con los cambios hormonales que se producen antes de la [place]. Las hormonas son sustancias qumicas que afectan el funcionamiento del cuerpo. Cules son los signos o sntomas? Los sntomas fsicos incluyen los siguientes: Dolores de cabeza. Inflamacin y aumento de peso. Mamas hinchadas o doloridas. Mucho cansancio. Dolor de espalda. Hinchazn de las manos y los pies. Diarrea, que son heces acuosas, o problemas para defecar (estreimiento). Los sntomas emocionales incluyen: Cambios en el estado de nimo. Ansiedad Depresin. Enojo o ataques de ira. Enojarse con facilidad (irritabilidad). Llanto. Los sntomas conductuales incluyen: Antojos de comida o cambios del apetito. Cambios en el deseo sexual. Confusin. Aislamiento social. No poder concentrarse bien. Dificultad para dormir. Los sntomas suelen presentarse todos los meses. Los sntomas varan segn las dealer. Cmo se diagnostica? El SPM puede diagnosticarse segn los antecedentes de los sntomas. El SPM suele diagnosticarse si los sntomas: Aparecen [address]  [id number] antes de que empiece a armed forces training and education officer. Finalizan 4 das despus de que comienza el perodo. Suceden al menos 3 meses seguidos. Interfieren en algunas de sus actividades habituales. Se deben descartar otros problemas que pueden causar algunos de estos sntomas antes de que se diagnostique el sndrome premenstrual. Entre ellas, depresin, ansiedad, anemia y problemas de  tiroides. Cmo se trata? El SPM puede tratarse de la siguiente manera: Tener un estilo de vida saludable. Esto incluye comer alimentos saludables y hacer ejercicio con regularidad. Medicamentos de [employer]. Estos pueden ayudar a [employer] siguientes sntomas: [place]. [employer]. Dolores de cabeza. Dolor a la palpacin en las mamas. Otros medicamentos, si es necesario. Si los sntomas son muy intensos, el mdico puede recetarle medicamentos. Siga estas instrucciones en su casa: Comida y bebida  Siga una alimentacin bien equilibrada. Evite la cafena y el alcohol. Limite la cantidad de alimentos salados y dulces que consume. Esto puede ayudar a building services engineer. Beba ms lquido [employer] indicado. Tome un multivitamnico, si se lo indican. Estilo de vida  No fume, vapee ni consuma nicotina o tabaco. Haga ejercicio segn las indicaciones. Duerme lo suficiente. La mayora de los adultos deberan dormir entre 7 y 8 horas cada noche. Utilice tcnicas de relajacin como las siguientes: Yoga. Ejercicios de respiracin. Meditacin. Busque formas saludables de charity fundraiser. Instrucciones generales  Para que el mdico pueda elegir el mejor tratamiento para usted, durante 2 o 3 meses, anote lo siguiente: Sus sntomas. Si sus sntomas son leves o muy intensos. Cunto tiempo federal-mogul. Tome los medicamentos nicamente segn las indicaciones. Si toma pldoras anticonceptivas, tmelas segn las indicaciones. Comunquese con un mdico si: Los sntomas empeoran. Tiene nuevos sntomas. Tiene dificultad para [employer] cotidianas. Esta informacin no tiene theme park manager el consejo del mdico. Asegrese de hacerle al mdico cualquier pregunta que tenga. Document Revised: 06/13/2023 Document Reviewed: 06/13/2023 Elsevier Patient Education  2025 [employer].

## 2024-07-25 NOTE — Assessment & Plan Note (Addendum)
 Managed with levothyroxine  137 mcg.  - Ordered thyroid  function tests.

## 2024-07-26 LAB — ANEMIA PROFILE B
Basophils Absolute: 0 10*3/uL (ref 0.0–0.2)
Basos: 1 %
EOS (ABSOLUTE): 0.2 10*3/uL (ref 0.0–0.4)
Eos: 3 %
Ferritin: 320 ng/mL — ABNORMAL HIGH (ref 15–150)
Folate: 16.5 ng/mL
Hematocrit: 43.5 % (ref 34.0–46.6)
Hemoglobin: 14.2 g/dL (ref 11.1–15.9)
Immature Grans (Abs): 0 10*3/uL (ref 0.0–0.1)
Immature Granulocytes: 0 %
Iron Saturation: 49 % (ref 15–55)
Iron: 141 ug/dL (ref 27–159)
Lymphocytes Absolute: 1.6 10*3/uL (ref 0.7–3.1)
Lymphs: 26 %
MCH: 31.6 pg (ref 26.6–33.0)
MCHC: 32.6 g/dL (ref 31.5–35.7)
MCV: 97 fL (ref 79–97)
Monocytes Absolute: 0.5 10*3/uL (ref 0.1–0.9)
Monocytes: 8 %
Neutrophils Absolute: 3.9 10*3/uL (ref 1.4–7.0)
Neutrophils: 61 %
Platelets: 258 10*3/uL (ref 150–450)
RBC: 4.49 x10E6/uL (ref 3.77–5.28)
RDW: 12.5 % (ref 11.7–15.4)
Retic Ct Pct: 2.9 % — ABNORMAL HIGH (ref 0.6–2.6)
Total Iron Binding Capacity: 287 ug/dL (ref 250–450)
UIBC: 146 ug/dL (ref 131–425)
Vitamin B-12: 569 pg/mL (ref 232–1245)
WBC: 6.3 10*3/uL (ref 3.4–10.8)

## 2024-07-26 LAB — HEPATITIS B SURFACE ANTIBODY, QUANTITATIVE: Hepatitis B Surf Ab Quant: 490 m[IU]/mL

## 2024-07-26 LAB — BASIC METABOLIC PANEL WITH GFR
BUN/Creatinine Ratio: 18 (ref 9–23)
BUN: 15 mg/dL (ref 6–24)
CO2: 20 mmol/L (ref 20–29)
Calcium: 9.1 mg/dL (ref 8.7–10.2)
Chloride: 102 mmol/L (ref 96–106)
Creatinine, Ser: 0.85 mg/dL (ref 0.57–1.00)
Glucose: 84 mg/dL (ref 70–99)
Potassium: 3.8 mmol/L (ref 3.5–5.2)
Sodium: 134 mmol/L (ref 134–144)
eGFR: 87 mL/min/{1.73_m2}

## 2024-07-26 LAB — TSH: TSH: 4.14 u[IU]/mL (ref 0.450–4.500)

## 2024-07-27 ENCOUNTER — Ambulatory Visit: Payer: Self-pay | Admitting: Family Medicine

## 2024-07-27 NOTE — Telephone Encounter (Signed)
 Test results discussed. Continue same dose of Synthroid . All other tests looks good. Follow up soon with reassessment if needed. She was appreciative of the call.
